# Patient Record
Sex: Male | Born: 1945 | Race: White | Hispanic: No | Marital: Single
Health system: Northeastern US, Academic
[De-identification: ages and names within clinical notes are randomized; demographics above are authoritative.]

## PROBLEM LIST (undated history)

## (undated) DIAGNOSIS — H9190 Unspecified hearing loss, unspecified ear: Secondary | ICD-10-CM

## (undated) DIAGNOSIS — M1990 Unspecified osteoarthritis, unspecified site: Secondary | ICD-10-CM

## (undated) HISTORY — PX: FRACTURE SURGERY: SHX138

## (undated) HISTORY — DX: Unspecified osteoarthritis, unspecified site: M19.90

---

## 2010-04-25 ENCOUNTER — Emergency Department (HOSPITAL_COMMUNITY): Admission: EM | Admit: 2010-04-25 | Discharge: 2010-04-25 | Payer: Self-pay | Admitting: Emergency Medicine

## 2013-10-05 ENCOUNTER — Ambulatory Visit (INDEPENDENT_AMBULATORY_CARE_PROVIDER_SITE_OTHER): Payer: Medicare HMO | Admitting: Family

## 2013-10-05 ENCOUNTER — Encounter: Payer: Self-pay | Admitting: Family

## 2013-10-05 VITALS — BP 130/84 | HR 58 | Ht 70.0 in | Wt 178.0 lb

## 2013-10-05 DIAGNOSIS — L409 Psoriasis, unspecified: Secondary | ICD-10-CM

## 2013-10-05 DIAGNOSIS — R200 Anesthesia of skin: Secondary | ICD-10-CM

## 2013-10-05 DIAGNOSIS — E7800 Pure hypercholesterolemia, unspecified: Secondary | ICD-10-CM

## 2013-10-05 DIAGNOSIS — L408 Other psoriasis: Secondary | ICD-10-CM

## 2013-10-05 DIAGNOSIS — Z87828 Personal history of other (healed) physical injury and trauma: Secondary | ICD-10-CM | POA: Insufficient documentation

## 2013-10-05 DIAGNOSIS — R209 Unspecified disturbances of skin sensation: Secondary | ICD-10-CM

## 2013-10-05 LAB — BASIC METABOLIC PANEL
BUN: 20 mg/dL (ref 6–23)
CALCIUM: 9 mg/dL (ref 8.4–10.5)
CHLORIDE: 107 meq/L (ref 96–112)
CO2: 29 meq/L (ref 19–32)
Creatinine, Ser: 1 mg/dL (ref 0.4–1.5)
GFR: 79.26 mL/min (ref >60.00)
GLUCOSE: 84 mg/dL (ref 70–99)
POTASSIUM: 4.3 meq/L (ref 3.5–5.1)
SODIUM: 141 meq/L (ref 135–145)

## 2013-10-05 LAB — CBC WITH DIFFERENTIAL/PLATELET
BASOS ABS: 0 10*3/uL (ref 0.0–0.1)
Basophils Relative: 0.4 % (ref 0.0–3.0)
EOS PCT: 0.9 % (ref 0.0–5.0)
Eosinophils Absolute: 0.1 10*3/uL (ref 0.0–0.7)
HCT: 42.4 % (ref 39.0–52.0)
Hemoglobin: 14.5 g/dL (ref 13.0–17.0)
LYMPHS PCT: 16.3 % (ref 12.0–46.0)
Lymphs Abs: 1.3 10*3/uL (ref 0.7–4.0)
MCHC: 34.2 g/dL (ref 30.0–36.0)
MCV: 93.4 fl (ref 78.0–100.0)
MONO ABS: 0.6 10*3/uL (ref 0.1–1.0)
MONOS PCT: 7.3 % (ref 3.0–12.0)
Neutro Abs: 5.8 10*3/uL (ref 1.4–7.7)
Neutrophils Relative %: 75.1 % (ref 43.0–77.0)
Platelets: 154 10*3/uL (ref 150.0–400.0)
RBC: 4.54 Mil/uL (ref 4.22–5.81)
RDW: 13.7 % (ref 11.5–14.6)
WBC: 7.8 10*3/uL (ref 4.5–10.5)

## 2013-10-05 LAB — LIPID PANEL
CHOL/HDL RATIO: 5
CHOLESTEROL: 179 mg/dL (ref 0–200)
HDL: 35.7 mg/dL — AB (ref >39.00)
LDL Cholesterol: 105 mg/dL — ABNORMAL HIGH (ref 0–99)
Triglycerides: 193 mg/dL — ABNORMAL HIGH (ref 0.0–149.0)
VLDL: 38.6 mg/dL (ref 0.0–40.0)

## 2013-10-05 LAB — HEPATIC FUNCTION PANEL
ALBUMIN: 3.7 g/dL (ref 3.5–5.2)
ALT: 14 U/L (ref 0–53)
AST: 17 U/L (ref 0–37)
Alkaline Phosphatase: 114 U/L (ref 39–117)
BILIRUBIN TOTAL: 1 mg/dL (ref 0.3–1.2)
Bilirubin, Direct: 0.1 mg/dL (ref 0.0–0.3)
TOTAL PROTEIN: 6.7 g/dL (ref 6.0–8.3)

## 2013-10-05 LAB — TSH: TSH: 0.89 u[IU]/mL (ref 0.35–5.50)

## 2013-10-05 NOTE — Patient Instructions (Signed)
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
## 2013-10-05 NOTE — Progress Notes (Signed)
Pre visit review using our clinic review tool, if applicable. No additional management support is needed unless otherwise documented below in the visit note. 

## 2013-10-18 ENCOUNTER — Encounter: Payer: Self-pay | Admitting: Family

## 2013-10-18 ENCOUNTER — Ambulatory Visit (INDEPENDENT_AMBULATORY_CARE_PROVIDER_SITE_OTHER): Payer: Medicare HMO | Admitting: Family

## 2013-10-18 VITALS — BP 140/78 | HR 65 | Ht 70.0 in | Wt 181.0 lb

## 2013-10-18 DIAGNOSIS — Z125 Encounter for screening for malignant neoplasm of prostate: Secondary | ICD-10-CM

## 2013-10-18 DIAGNOSIS — Z23 Encounter for immunization: Secondary | ICD-10-CM

## 2013-10-18 DIAGNOSIS — Z0000 Encounter for general adult medical examination without abnormal findings: Secondary | ICD-10-CM

## 2013-10-18 DIAGNOSIS — R413 Other amnesia: Secondary | ICD-10-CM

## 2013-10-18 LAB — PSA: PSA: 2.17 ng/mL (ref 0.10–4.00)

## 2013-10-18 NOTE — Progress Notes (Signed)
Pre visit review using our clinic review tool, if applicable. No additional management support is needed unless otherwise documented below in the visit note. 

## 2013-10-18 NOTE — Patient Instructions (Signed)
Dementia Dementia is a general term for problems with brain function. A person with dementia has memory loss and a hard time with at least one other brain function such as thinking, speaking, or problem solving. Dementia can affect social functioning, how you do your job, your mood, or your personality. The changes may be hidden for a long time. The earliest forms of this disease are usually not detected by family or friends. Dementia can be:  Irreversible.  Potentially reversible.  Partially reversible.  Progressive. This means it can get worse over time. CAUSES  Irreversible dementia causes may include:  Degeneration of brain cells (Alzheimer's disease or lewy body dementia).  Multiple small strokes (vascular dementia).  Infection (chronic meningitis or Creutzfelt-Jakob disease).  Frontotemporal dementia. This affects younger people, age 40 to 70, compared to those who have Alzheimer's disease.  Dementia associated with other disorders like Parkinson's disease, Huntington's disease, or HIV-associated dementia. Potentially or partially reversible dementia causes may include:  Medicines.  Metabolic causes such as excessive alcohol intake, vitamin B12 deficiency, or thyroid disease.  Masses or pressure in the brain such as a tumor, blood clot, or hydrocephalus. SYMPTOMS  Symptoms are often hard to detect. Family members or coworkers may not notice them early in the disease process. Different people with dementia may have different symptoms. Symptoms can include:  A hard time with memory, especially recent memory. Long-term memory may not be impaired.  Asking the same question multiple times or forgetting something someone just said.  A hard time speaking your thoughts or finding certain words.  A hard time solving problems or performing familiar tasks (such as how to use a telephone).  Sudden changes in mood.  Changes in personality, especially increasing moodiness or  mistrust.  Depression.  A hard time understanding complex ideas that were never a problem in the past. DIAGNOSIS  There are no specific tests for dementia.   Your caregiver may recommend a thorough evaluation. This is because some forms of dementia can be reversible. The evaluation will likely include a physical exam and getting a detailed history from you and a family member. The history often gives the best clues and suggestions for a diagnosis.  Memory testing may be done. A detailed brain function evaluation called neuropsychologic testing may be helpful.  Lab tests and brain imaging (such as a CT scan or MRI scan) are sometimes important.  Sometimes observation and re-evaluation over time is very helpful. TREATMENT  Treatment depends on the cause.   If the problem is a vitamin deficiency, it may be helped or cured with supplements.  For dementias such as Alzheimer's disease, medicines are available to stabilize or slow the course of the disease. There are no cures for this type of dementia.  Your caregiver can help direct you to groups, organizations, and other caregivers to help with decisions in the care of you or your loved one. HOME CARE INSTRUCTIONS The care of individuals with dementia is varied and dependent upon the progression of the dementia. The following suggestions are intended for the person living with, or caring for, the person with dementia.  Create a safe environment.  Remove the locks on bathroom doors to prevent the person from accidentally locking himself or herself in.  Use childproof latches on kitchen cabinets and any place where cleaning supplies, chemicals, or alcohol are kept.  Use childproof covers in unused electrical outlets.  Install childproof devices to keep doors and windows secured.  Remove stove knobs or install safety   knobs and an automatic shut-off on the stove.  Lower the temperature on water heaters.  Label medicines and keep them  locked up.  Secure knives, lighters, matches, power tools, and guns, and keep these items out of reach.  Keep the house free from clutter. Remove rugs or anything that might contribute to a fall.  Remove objects that might break and hurt the person.  Make sure lighting is good, both inside and outside.  Install grab rails as needed.  Use a monitoring device to alert you to falls or other needs for help.  Reduce confusion.  Keep familiar objects and people around.  Use night lights or dim lights at night.  Label items or areas.  Use reminders, notes, or directions for daily activities or tasks.  Keep a simple, consistent routine for waking, meals, bathing, dressing, and bedtime.  Create a calm, quiet environment.  Place large clocks and calendars prominently.  Display emergency numbers and home address near all telephones.  Use cues to establish different times of the day. An example is to open curtains to let the natural light in during the day.   Use effective communication.  Choose simple words and short sentences.  Use a gentle, calm tone of voice.  Be careful not to interrupt.  If the person is struggling to find a word or communicate a thought, try to provide the word or thought.  Ask one question at a time. Allow the person ample time to answer questions. Repeat the question again if the person does not respond.  Reduce nighttime restlessness.  Provide a comfortable bed.  Have a consistent nighttime routine.  Ensure a regular walking or physical activity schedule. Involve the person in daily activities as much as possible.  Limit napping during the day.  Limit caffeine.  Attend social events that stimulate rather than overwhelm the senses.  Encourage good nutrition and hydration.  Reduce distractions during meal times and snacks.  Avoid foods that are too hot or too cold.  Monitor chewing and swallowing ability.  Continue with routine vision,  hearing, dental, and medical screenings.  Only give over-the-counter or prescription medicines as directed by the caregiver.  Monitor driving abilities. Do not allow the person to drive when safe driving is no longer possible.  Register with an identification program which could provide location assistance in the event of a missing person situation. SEEK MEDICAL CARE IF:   New behavioral problems start such as moodiness, aggressiveness, or seeing things that are not there (hallucinations).  Any new problem with brain function happens. This includes problems with balance, speech, or falling a lot.  Problems with swallowing develop.  Any symptoms of other illness happen. Small changes or worsening in any aspect of brain function can be a sign that the illness is getting worse. It can also be a sign of another medical illness such as infection. Seeing a caregiver right away is important. SEEK IMMEDIATE MEDICAL CARE IF:   A fever develops.  New or worsened confusion develops.  New or worsened sleepiness develops.  Staying awake becomes hard to do. Document Released: 03/04/2001 Document Revised: 12/01/2011 Document Reviewed: 02/03/2011 [employer] Patient Information 2014 [employer].  

## 2013-10-18 NOTE — Progress Notes (Signed)
Subjective:    Patient ID: Richard Hutchinson, male    DOB: 03-Nov-1945, 68 y.o.   MRN: [id number]  HPI 68 year old male, nonsmoker, hearing-impaired male is in today for CPX. He has concerns that his memory is not as good as it once was. Has no problems with long-term memory but short-term memory is not good. Wife complains that he often wanders. He reports misplacing items at times.  All immunizations and health maintenance protocols were reviewed with the patient and they are up to date with these protocols. Screening laboratory values were reviewed with the patient including screening of hyperlipidemia PSA renal function and hepatic function. There medications past medical history social history problem list and allergies were reviewed in detail. Goals were established with regard to weight loss exercise diet in compliance with medications   Review of Systems  Constitutional: Negative.        Hearing-impaired  Eyes: Negative.   Respiratory: Negative.   Cardiovascular: Negative.   Gastrointestinal: Negative.   Endocrine: Negative.   Genitourinary: Negative.   Musculoskeletal: Negative.   Skin: Negative.   Neurological: Negative for dizziness, facial asymmetry and headaches.       Memory disturbance  Hematological: Negative.   Psychiatric/Behavioral: Negative.    Past Medical History  Diagnosis Date  . Arthritis     History   Social History  . Marital Status: Married    Spouse Name: N/A    Number of Children: N/A  . Years of Education: N/A   Occupational History  . Not on file.   Social History Main Topics  . Smoking status: Never Smoker   . Smokeless tobacco: Not on file  . Alcohol Use: Yes  . Drug Use: No  . Sexual Activity: Not on file   Other Topics Concern  . Not on file   Social History Narrative  . No narrative on file    History reviewed. No pertinent past surgical history.  No family history on file.  No Known Allergies  No current outpatient  prescriptions on file prior to visit.   No current facility-administered medications on file prior to visit.    BP 140/78  Pulse 65  Ht 5\' 10"  (1.778 m)  Wt 181 lb (82.101 kg)  BMI 25.97 kg/m2chart    Objective:   Physical Exam  Constitutional: He is oriented to person, place, and time. He appears well-developed and well-nourished.  Hearing-impaired, interpreter present  HENT:  Head: Normocephalic and atraumatic.  Right Ear: External ear normal.  Left Ear: External ear normal.  Nose: Nose normal.  Mouth/Throat: Oropharynx is clear and moist.  Eyes: Conjunctivae and EOM are normal. Pupils are equal, round, and reactive to light.  Neck: Normal range of motion. Neck supple. No thyromegaly present.  Cardiovascular: Normal rate, regular rhythm and normal heart sounds.   Pulmonary/Chest: Effort normal and breath sounds normal.  Abdominal: Soft. Bowel sounds are normal. He exhibits no distension. There is no tenderness. There is no guarding.  Genitourinary: Rectum normal, prostate normal and penis normal. Guaiac negative stool. No penile tenderness.  Musculoskeletal: Normal range of motion. He exhibits no edema and no tenderness.  Neurological: He is alert and oriented to person, place, and time. He has normal reflexes.  Skin: Skin is warm and dry.  Psychiatric: He has a normal mood and affect.          Assessment & Plan:  Assessment:  1. complete physical exam-PSA sent. Encouraged healthy diet and exercise. Recommended colonoscopy. Patient  wishes to call me back and let me now when he is ready to proceed with that procedure. Declines today. 2. Memory disturbance-encourage CT of the head to further evaluate memory disturbance. Patient declines at this time. Therefore, we will not consider medication until we can rule out any other cause of possible memory disturbance. 3. Hearing impaired

## 2013-11-22 ENCOUNTER — Telehealth: Payer: Self-pay | Admitting: Family

## 2013-11-22 DIAGNOSIS — M5416 Radiculopathy, lumbar region: Secondary | ICD-10-CM

## 2013-11-22 DIAGNOSIS — R413 Other amnesia: Secondary | ICD-10-CM

## 2013-11-22 NOTE — Telephone Encounter (Signed)
Received the following email from patient.  Returned call to pt's wife per his request and advised per PCP and chart note, pt had previously declined order for CT scan of brain and back and also referral to GI for colonoscopy.  Wife states pt would like to have order for CT scans to be performed in April as patient will be going out of town and will not return until March 30th.  Wife states she will discuss the referral to GI with patient and then follow up with [place] later.  [place] to enter order, wife to assist patient with signing up for mychart.    I don't know what steps I need to take next. I know you were going to do a back scan to see if that is the problem with my circulation and also brain scan to check why my memory is failing. Should I find a doctor for both of these things or are you going to recommend someone?   Thank you.   Lino [place] can contact my wife with any information in this regard.   She can be reached at [email or web address] or [phone]. Her name is Richard Hutchinson

## 2013-12-26 ENCOUNTER — Emergency Department (HOSPITAL_COMMUNITY)
Admission: EM | Admit: 2013-12-26 | Discharge: 2013-12-26 | Disposition: A | Discharge status: Home / Self Care | Payer: Medicare HMO | Attending: Emergency Medicine | Admitting: Emergency Medicine

## 2013-12-26 ENCOUNTER — Encounter (HOSPITAL_COMMUNITY): Payer: Self-pay | Admitting: Emergency Medicine

## 2013-12-26 ENCOUNTER — Emergency Department (HOSPITAL_COMMUNITY): Payer: Medicare HMO

## 2013-12-26 DIAGNOSIS — M1710 Unilateral primary osteoarthritis, unspecified knee: Secondary | ICD-10-CM | POA: Diagnosis not present

## 2013-12-26 DIAGNOSIS — Z791 Long term (current) use of non-steroidal anti-inflammatories (NSAID): Secondary | ICD-10-CM | POA: Diagnosis not present

## 2013-12-26 DIAGNOSIS — IMO0002 Reserved for concepts with insufficient information to code with codable children: Principal | ICD-10-CM

## 2013-12-26 DIAGNOSIS — M25569 Pain in unspecified knee: Secondary | ICD-10-CM | POA: Diagnosis present

## 2013-12-26 DIAGNOSIS — M1712 Unilateral primary osteoarthritis, left knee: Secondary | ICD-10-CM

## 2013-12-26 MED ORDER — HYDROCODONE-ACETAMINOPHEN 5-325 MG PO TABS: 2.0000 | ORAL_TABLET | ORAL | Status: DC | PRN

## 2013-12-26 MED ORDER — NAPROXEN 500 MG PO TABS: 500.0000 mg | ORAL_TABLET | Freq: Two times a day (BID) | ORAL | Status: DC

## 2013-12-26 MED ORDER — NAPROXEN 250 MG PO TABS
500.0000 mg | ORAL_TABLET | Freq: Once | ORAL | Status: AC
  Administered 2013-12-26: 500 mg via ORAL
  Filled 2013-12-26: qty 2

## 2013-12-26 NOTE — ED Notes (Signed)
Pt st's he was in [place] approx 1 month ago and stuck by a cactus in his left knee.  Pt st's his knee became infected.  St's he has continued to have pain in this knee with swelling.  Thinks maybe there is a broken off thorn in his knee.

## 2013-12-26 NOTE — ED Provider Notes (Signed)
This patient was seen by Junius Creamer, NP.   He is deaf and mute. He presents for left knee pain and the primary interview was done writing on paper with pen. 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 At the ned of her shift, they were still waiting for the interpretor therefore I was asked to discuss his case and plan with him.  The patient confirms what Junius Creamer, NP writes in her HPI. I agree with her physical exam findings, diagnosis and treatment plan. I discussed the plan with the patient using interpretor and answered all of his questions.  68 y.o.Devean Dolliver's evaluation in the Emergency Department is complete. It has been determined that no acute conditions requiring further emergency intervention are present at this time. The patient/guardian have been advised of the diagnosis and plan. We have discussed signs and symptoms that warrant return to the ED, such as changes or worsening in symptoms.  Vital signs are stable at discharge. Filed Vitals:   12/26/13 0703  BP: 132/79  Pulse: 66  Temp:   Resp:     Patient/guardian has voiced understanding and agreed to follow-up with the PCP or specialist.   Linus Mako, PA-C 12/26/13 [phone]

## 2013-12-26 NOTE — ED Provider Notes (Signed)
CSN: [id number]     Arrival date & time 12/26/13  0137 History   First MD Initiated Contact with Patient 12/26/13 0440     Chief Complaint  Patient presents with  . Knee Pain     (Consider location/radiation/quality/duration/timing/severity/associated sxs/prior Treatment) HPI Comments: Patient is a deaf, mute, with no murmurs use this form of communication.  He, states, that approximately one month ago.  He was in [place] and accidentally was stuck by a cactus needle in his left knee.  At that time.  He became quite red and inflamed.  He was placed on antibiotics, and the swelling, and redness subsided.  Since that time has had persistent pain, tonight, worse.  Has not taken any over-the-counter medication or any prescribed medication for discomfort. He does have a local physician, but has not made an appointment for evaluation  Patient is a 68 y.o. male presenting with knee pain. The history is provided by the patient. The history is limited by a language barrier.  Knee Pain Location:  Knee Time since incident:  1 month Injury: yes   Mechanism of injury comment:  Cactus needle Knee location:  L knee Pain details:    Quality:  Sharp   Radiates to:  Does not radiate   Severity:  Moderate   Onset quality:  Gradual   Duration:  1 month   Timing:  Constant   Progression:  Worsening Chronicity:  Recurrent Dislocation: no   Foreign body present:  Unable to specify Tetanus status:  Up to date Prior injury to area:  Yes Relieved by:  None tried Ineffective treatments:  None tried Associated symptoms: no decreased ROM, no fever and no swelling     Past Medical History  Diagnosis Date  . Arthritis    Past Surgical History  Procedure Laterality Date  . Fracture surgery     No family history on file. History  Substance Use Topics  . Smoking status: Never Smoker   . Smokeless tobacco: Not on file  . Alcohol Use: Yes    Review of Systems  Constitutional: Negative for fever.   Musculoskeletal: Positive for arthralgias. Negative for joint swelling.  Skin: Negative for rash and wound.  Neurological: Negative for dizziness.  All other systems reviewed and are negative.      Allergies  Review of patient's allergies indicates no known allergies.  Home Medications   Current Outpatient Rx  Name  Route  Sig  Dispense  Refill  . HYDROcodone-acetaminophen (NORCO/VICODIN) 5-325 MG per tablet   Oral   Take 2 tablets by mouth every 4 (four) hours as needed.   20 tablet   0   . naproxen (NAPROSYN) 500 MG tablet   Oral   Take 1 tablet (500 mg total) by mouth 2 (two) times daily with a meal.   60 tablet   0    BP 139/88  Pulse 96  Temp(Src) 97.5 F (36.4 C) (Oral)  Resp 16  Ht 6' (1.829 m)  Wt 185 lb (83.915 kg)  BMI 25.08 kg/m2  SpO2 97% Physical Exam  Nursing note and vitals reviewed. Constitutional: He appears well-developed and well-nourished.  HENT:  Head: Normocephalic.  Eyes: Pupils are equal, round, and reactive to light.  Neck: Normal range of motion.  Cardiovascular: Normal rate and regular rhythm.   Pulmonary/Chest: Effort normal and breath sounds normal.  Musculoskeletal: He exhibits tenderness. He exhibits no edema.       Left knee: He exhibits normal range of motion, no  swelling, no effusion, no deformity, no laceration, no erythema, normal alignment, no LCL laxity and no bony tenderness. Tenderness found. Medial joint line tenderness noted.       Legs: Skin: Skin is warm. No erythema.    ED Course  Procedures (including critical care time) Labs Review Labs Reviewed - No data to display Imaging Review Dg Knee Complete 4 Views Left  12/26/2013   CLINICAL DATA:  Injury from cactus to left knee, with persistent left knee pain and swelling. Assess for foreign body.  EXAM: LEFT KNEE - COMPLETE 4+ VIEW  COMPARISON:  None.  FINDINGS: There is no evidence of fracture or dislocation. There is mild medial compartment narrowing. Marginal  osteophytes are seen arising at the medial and patellofemoral compartments. Mild cortical irregularity is noted along the articular surface of the lateral femoral condyle.  A small knee joint effusion is seen. No definite radiopaque foreign body is identified; a cactus needle may not be visible on radiograph.  IMPRESSION: 1. No evidence of fracture or dislocation. 2. Tricompartmental osteoarthritis noted, with mild narrowing of the medial compartment. 3. Small knee joint effusion seen. 4. No definite radiopaque foreign body seen; a cactus needle may not be visible on radiograph.   Electronically Signed   By: Garald Balding M.D.   On: 12/26/2013 04:01     EKG Interpretation None      MDM  I'm awaiting the sign language interpreter for discharge instructions Final diagnoses:  Arthritis of left knee         Garald Balding, NP 12/26/13 0530

## 2013-12-26 NOTE — Discharge Instructions (Signed)
You've been given 2 prescriptions today.  One is an anti-inflammatory that, I would like you to take on a regular basis, as instructed.  The second is for a narcotic pain medicine that, you can take for severe pain, basically if you going to stay home.  He should not drive or operate machinery with this. Please make up with a primary care physician for further evaluation and follow

## 2013-12-27 NOTE — ED Provider Notes (Signed)
Medical screening examination/treatment/procedure(s) were performed by non-physician practitioner and as supervising physician I was immediately available for consultation/collaboration.   Teressa Lower, MD 12/27/13 [phone]

## 2013-12-28 NOTE — ED Provider Notes (Signed)
Medical screening examination/treatment/procedure(s) were performed by non-physician practitioner and as supervising physician I was immediately available for consultation/collaboration.   EKG Interpretation None       Teressa Lower, MD 12/28/13 [phone]

## 2014-01-02 ENCOUNTER — Encounter: Payer: Self-pay | Admitting: Family

## 2014-01-02 ENCOUNTER — Ambulatory Visit (INDEPENDENT_AMBULATORY_CARE_PROVIDER_SITE_OTHER): Payer: Medicare HMO | Admitting: Family

## 2014-01-02 VITALS — BP 142/80 | HR 88 | Temp 97.8°F | Ht 72.0 in | Wt 200.0 lb

## 2014-01-02 DIAGNOSIS — L309 Dermatitis, unspecified: Secondary | ICD-10-CM

## 2014-01-02 DIAGNOSIS — M1712 Unilateral primary osteoarthritis, left knee: Secondary | ICD-10-CM

## 2014-01-02 DIAGNOSIS — M1710 Unilateral primary osteoarthritis, unspecified knee: Secondary | ICD-10-CM

## 2014-01-02 DIAGNOSIS — L259 Unspecified contact dermatitis, unspecified cause: Secondary | ICD-10-CM

## 2014-01-02 DIAGNOSIS — IMO0002 Reserved for concepts with insufficient information to code with codable children: Secondary | ICD-10-CM

## 2014-01-02 MED ORDER — DOXYCYCLINE HYCLATE 100 MG PO TABS: 100.0000 mg | ORAL_TABLET | Freq: Two times a day (BID) | ORAL | Status: DC

## 2014-01-02 MED ORDER — CLOTRIMAZOLE-BETAMETHASONE 1-0.05 % EX CREA: 1.0000 "application " | TOPICAL_CREAM | Freq: Two times a day (BID) | CUTANEOUS | Status: AC

## 2014-01-02 MED ORDER — MELOXICAM 7.5 MG PO TABS: 7.5000 mg | ORAL_TABLET | Freq: Every day | ORAL | Status: DC

## 2014-01-02 NOTE — Progress Notes (Signed)
Subjective:    Patient ID: Richard Hutchinson, male    DOB: [birth year], 68 y.o.   MRN: [id number]  HPI  Is a 68 year old white male, nonsmoker is in today for recheck of cellulitis that was seen on 12/05/2013 in [place]. Patient reports being scratched by a cactus and had 5 areas of concern. Reports having severe pain with left foot swelling and was put on Bactrim twice a day. Is improving but still think he needs more antibiotic.  Has concerns of left knee pain has a history of known osteoarthritis of the left knee. Continue naproxen prescribed by the emergency department that was held about 50% and applying ice. Pain 5/10 worst person in the morning.  The patient reports that he is currently under the care of the therapist. Wife reports that they aren't sure if he has a personality disorder or early dementia.  Review of Systems  Constitutional: Negative.   HENT: Negative.   Respiratory: Negative.   Musculoskeletal: Positive for arthralgias.       Left knee pain  Skin: Positive for color change, rash and wound.       Cellulitis left lower leg from a cactus  Neurological: Negative.   Psychiatric/Behavioral: Negative.    Past Medical History  Diagnosis Date  . Arthritis     History   Social History  . Marital Status: Married    Spouse Name: N/A    Number of Children: N/A  . Years of Education: N/A   Occupational History  . Not on file.   Social History Main Topics  . Smoking status: Never Smoker   . Smokeless tobacco: Not on file  . Alcohol Use: Yes  . Drug Use: No  . Sexual Activity: Not on file   Other Topics Concern  . Not on file   Social History Narrative  . No narrative on file    Past Surgical History  Procedure Laterality Date  . Fracture surgery      No family history on file.  No Known Allergies  No current outpatient prescriptions on file prior to visit.   No current facility-administered medications on file prior to visit.    BP 142/80   Pulse 88  Temp(Src) 97.8 F (36.6 C) (Oral)  Ht 6' (1.829 m)  Wt 200 lb (90.719 kg)  BMI 27.12 kg/m2  SpO2 98%chart     Objective:   Physical Exam  Constitutional: He is oriented to person, place, and time. He appears well-developed and well-nourished.  HENT:  Right Ear: External ear normal.  Left Ear: External ear normal.  Nose: Nose normal.  Mouth/Throat: Oropharynx is clear and moist.  Neck: Normal range of motion. Neck supple.  Cardiovascular: Normal rate, regular rhythm and normal heart sounds.   Pulmonary/Chest: Effort normal and breath sounds normal.  Abdominal: Soft. Bowel sounds are normal.  Musculoskeletal: Normal range of motion. He exhibits no edema and no tenderness.  Neurological: He is oriented to person, place, and time.  Skin: Skin is warm. Rash noted. There is erythema.  Left lower leg dry, flaky rash noted to the anterior aspect of the shin. Mild redness extending from the lower leg to the foot. Blanches well. No pain.  Psychiatric: He has a normal mood and affect.          Assessment & Plan:  Richard Hutchinson was seen today for cellulitis.  Diagnoses and associated orders for this visit:  Dermatitis  Osteoarthritis of left knee  Other Orders - meloxicam (MOBIC) 7.5 MG  tablet; Take 1 tablet (7.5 mg total) by mouth daily. - doxycycline (VIBRA-TABS) 100 MG tablet; Take 1 tablet (100 mg total) by mouth 2 (two) times daily. - clotrimazole-betamethasone (LOTRISONE) cream; Apply 1 application topically 2 (two) times daily.   Call the office with any questions or concerns. Recheck as scheduled and as needed.

## 2014-01-02 NOTE — Progress Notes (Signed)
Pre visit review using our clinic review tool, if applicable. No additional management support is needed unless otherwise documented below in the visit note. 

## 2014-01-02 NOTE — Patient Instructions (Signed)
Wear and Tear Disorders of the Knee (Arthritis, Osteoarthritis)  Everyone will experience wear and tear injuries (arthritis, osteoarthritis) of the knee. These are the changes we all get as we age. They come from the joint stress of daily living. The amount of cartilage damage in your knee and your symptoms determine if you need surgery. Mild problems require approximately two months recovery time. More severe problems take several months to recover. With mild problems, your surgeon may find worn and rough cartilage surfaces. With severe changes, your surgeon may find cartilage that has completely worn away and exposed the bone. Loose bodies of bone and cartilage, bone spurs (excess bone growth), and injuries to the menisci (cushions between the large bones of your leg) are also common. All of these problems can cause pain.  For a mild wear and tear problem, rough cartilage may simply need to be shaved and smoothed. For more severe problems with areas of exposed bone, your surgeon may use an instrument for roughing up the bone surfaces to stimulate new cartilage growth. Loose bodies are usually removed. Torn menisci may be trimmed or repaired.  ABOUT THE ARTHROSCOPIC PROCEDURE  Arthroscopy is a surgical technique. It allows your orthopedic surgeon to diagnose and treat your knee injury with accuracy. The surgeon looks into your knee through a small scope. The scope is like a small (pencil-sized) telescope. Arthroscopy is less invasive than open knee surgery. You can expect a more rapid recovery. After the procedure, you will be moved to a recovery area until most of the effects of the medication have worn off. Your caregiver will discuss the test results with you.  RECOVERY  The severity of the arthritis and the type of procedure performed will determine recovery time. Other important factors include age, physical condition, medical conditions, and the type of rehabilitation program. Strengthening your muscles after  arthroscopy helps guarantee a better recovery. Follow your caregiver's instructions. Use crutches, rest, elevate, ice, and do knee exercises as instructed. Your caregivers will help you and instruct you with exercises and other physical therapy required to regain your mobility, muscle strength, and functioning following surgery. Only take over-the-counter or prescription medicines for pain, discomfort, or fever as directed by your caregiver.   SEEK MEDICAL CARE IF:   · There is increased bleeding (more than a small spot) from the wound.  · You notice redness, swelling, or increasing pain in the wound.  · Pus is coming from wound.  · You develop an unexplained oral temperature above 102° F (38.9° C) , or as your caregiver suggests.  · You notice a foul smell coming from the wound or dressing.  · You have severe pain with motion of the knee.  SEEK IMMEDIATE MEDICAL CARE IF:   · You develop a rash.  · You have difficulty breathing.  · You have any allergic problems.  MAKE SURE YOU:   · Understand these instructions.  · Will watch your condition.  · Will get help right away if you are not doing well or get worse.  Document Released: 09/05/2000 Document Revised: 12/01/2011 Document Reviewed: 02/02/2008  [employer]® Patient Information ©2014 [employer].

## 2014-01-06 ENCOUNTER — Encounter: Payer: Self-pay | Admitting: Family

## 2014-01-12 ENCOUNTER — Encounter: Payer: Self-pay | Admitting: Family

## 2014-01-16 ENCOUNTER — Inpatient Hospital Stay: Admission: RE | Admit: 2014-01-16 | Payer: Medicare HMO | Source: Ambulatory Visit

## 2014-01-20 ENCOUNTER — Other Ambulatory Visit: Payer: Medicare HMO

## 2014-05-25 ENCOUNTER — Telehealth: Payer: Self-pay | Admitting: Family

## 2014-05-25 ENCOUNTER — Telehealth: Payer: Self-pay | Admitting: *Deleted

## 2014-05-25 NOTE — Telephone Encounter (Signed)
Patient Information:  Caller Name: Mardene Celeste  Phone: [phone]  Patient: Carmichael, Burdette  Gender: Male  DOB: [birth year]  Age: 68 Years  PCP: Roxy Cedar [facility])  Office Follow Up:  Does the office need to follow up with this patient?: Yes  Instructions For The Office: Pt needs appt 05/26/14 and will need interpreter for the deaf at the appt, please f/u with pt's wife thank you   Symptoms  Reason For Call & Symptoms: Pt's wife states pt 's platelet count is 106 dated 05/24/14 and advised per another provider to call and schedule appt for pt to be seen in the office for f/u on blood work.  Normal range 150-400.  Pt wil need an appt asap and will also need an interpreter for the deaf at the time of appt.  Reviewed Health History In EMR: Yes  Reviewed Medications In EMR: Yes  Reviewed Allergies In EMR: Yes  Reviewed Surgeries / Procedures: Yes  Date of Onset of Symptoms: 05/25/2014  Guideline(s) Used:  No Protocol Available - Sick Adult  Disposition Per Guideline:   Discuss with PCP and Callback by Nurse Today  Reason For Disposition Reached:   Nursing judgment  Advice Given:  Call Back If:  New symptoms develop  You become worse.  Patient Will Follow Care Advice:  YES

## 2014-05-25 NOTE — Telephone Encounter (Signed)
Dr Grayland Jack

## 2014-05-25 NOTE — Telephone Encounter (Signed)
See result note from today. Pt scheduled to see Dr.Kim on 05/26/14.

## 2014-05-25 NOTE — Telephone Encounter (Signed)
Dr Willene Hatchet (psych) called to inform Richard Hutchinson  the platelet count was low at 106 and states she informed the pts wife as he is deaf and advised her the pt should discontinue Depakote.  Dr Maudie Mercury reviewed the results that were faxed and states the pt should be seen in the office tomorrow for repeat labs and evaluation.  I informed the pts wife of this and advised her per Dr Maudie Mercury if the pt is feeling bad or having any bleeding problems he should go to an urgent care now.  She stated the pt only complains of being tired and needs an intrepreter to come with him to the appt tomorrow as she has to work and per Richard Hutchinson they may not be able to have an intrepreter available as generally a 2 day notice is needed.  I transferred her to Richard Hutchinson to make an appt.

## 2014-05-25 NOTE — Telephone Encounter (Signed)
Error-See complete note.

## 2014-05-26 ENCOUNTER — Ambulatory Visit (INDEPENDENT_AMBULATORY_CARE_PROVIDER_SITE_OTHER): Payer: Medicare HMO | Admitting: Family Medicine

## 2014-05-26 ENCOUNTER — Encounter: Payer: Self-pay | Admitting: Family Medicine

## 2014-05-26 VITALS — BP 120/72 | HR 95 | Temp 97.5°F | Ht 72.0 in | Wt 171.0 lb

## 2014-05-26 DIAGNOSIS — D473 Essential (hemorrhagic) thrombocythemia: Secondary | ICD-10-CM

## 2014-05-26 DIAGNOSIS — D75839 Thrombocytosis, unspecified: Secondary | ICD-10-CM

## 2014-05-26 DIAGNOSIS — H9190 Unspecified hearing loss, unspecified ear: Secondary | ICD-10-CM

## 2014-05-26 DIAGNOSIS — I10 Essential (primary) hypertension: Secondary | ICD-10-CM

## 2014-05-26 DIAGNOSIS — T50905A Adverse effect of unspecified drugs, medicaments and biological substances, initial encounter: Secondary | ICD-10-CM

## 2014-05-26 DIAGNOSIS — F39 Unspecified mood [affective] disorder: Secondary | ICD-10-CM

## 2014-05-26 NOTE — Patient Instructions (Addendum)
-  continue to hold on the depakote and discuss any new medications with your psychiatrist  -follow up with Richard Hutchinson in 1 week and sooner if any symptoms, concerns, bleeding or new symptoms

## 2014-05-26 NOTE — Progress Notes (Addendum)
No chief complaint on file.   HPI: Pt deaf, interpreter present  Acute visit for:  "low platelets" -call from psychiatrist of incidental finding of low platelets on basic routine labs yesterday and ov schedule with me as PCP out of office -started depakote recently per patient report and office staff at psych office  - platelets normal recently prior to starting on depakote roc in may -OF NOTE: prior to appt. requested other labs from psych and he had low platelet 8/19 as well, rest of CBC, CMP, hgba1c ok, depakote level in normal range at that time - very mildly low wbc -also requested OV notes but denied due to hippa? -apparently depakote continued until yesterday, and patient reports just advised to hold depakote yesterday when mild thrombocytopenia persisted -reports: he feels fine, no symptoms -denies: malaise, fever, ha, petechia, purpura, bleeding (skin, GI, urine, mucosal surfaces)  ROS: See pertinent positives and negatives per HPI.  Past Medical History  Diagnosis Date  . Arthritis     Past Surgical History  Procedure Laterality Date  . Fracture surgery      No family history on file.  History   Social History  . Marital Status: Married    Spouse Name: N/A    Number of Children: N/A  . Years of Education: N/A   Social History Main Topics  . Smoking status: Never Smoker   . Smokeless tobacco: None  . Alcohol Use: Yes  . Drug Use: No  . Sexual Activity: None   Other Topics Concern  . None   Social History Narrative  . None    Current outpatient prescriptions:clotrimazole-betamethasone (LOTRISONE) cream, Apply 1 application topically 2 (two) times daily., Disp: 45 g, Rfl: 0;  risperiDONE (RISPERDAL) 2 MG tablet, at bedtime. , Disp: , Rfl:   EXAM:  Filed Vitals:   05/26/14 1326  BP: 120/72  Pulse: 95  Temp: 97.5 F (36.4 C)    Body mass index is 23.19 kg/(m^2).  GENERAL: vitals reviewed and listed above, alert, oriented, appears well hydrated  and in no acute distress  HEENT: atraumatic, PERRLA, conjunttiva clear, no obvious abnormalities on inspection of external nose and ears, oral and nasal mucosa normal  NECK: no obvious masses on inspection or cervical LAD  LUNGS: clear to auscultation bilaterally, no wheezes, rales or rhonchi, good air movement  CV: HRRR, no peripheral edema  SKIN: no petechiae or purpura  MS: moves all extremities without noticeable abnormality  PSYCH: pleasant and cooperative, no obvious depression or anxiety  ASSESSMENT AND PLAN:  Discussed the following assessment and plan:  Thrombocytosis  Unspecified episodic mood disorder  Deaf, unspecified laterality  Medication reaction, initial encounter  -we discussed possible serious and likely etiologies, workup and treatment, treatment risks and return precautions -asymptomatic mild thrombocytopenia for at least several weeks after starting depakote, likely drug induced -advised smear today, but patient reports he really hates getting blood drawn and is very adamant since he feels fine that he really does not want to draw blood again today -after this discussion, Jovante opted for - holding Depakote and close follow up with PCP in to recheck/obtain smear, consider hematology referral if persists or other concerns -of course, we advised Alexy  to return or notify a doctor immediately if symptoms worsen or persist or new concerns arise.  -Patient advised to return or notify a doctor immediately if symptoms worsen or persist or new concerns arise.  Patient Instructions  -continue to hold on the depakote and discuss any new  medications with your psychiatrist  -follow up with Padonda Cambell in 1 week and sooner if any symptoms, concerns, bleeding or new symptoms     Marjorie Deprey R.

## 2014-05-26 NOTE — Progress Notes (Signed)
Pre visit review using our clinic review tool, if applicable. No additional management support is needed unless otherwise documented below in the visit note. 

## 2014-06-02 ENCOUNTER — Ambulatory Visit (INDEPENDENT_AMBULATORY_CARE_PROVIDER_SITE_OTHER): Payer: Commercial Managed Care - HMO | Admitting: Family

## 2014-06-02 ENCOUNTER — Encounter: Payer: Self-pay | Admitting: Family

## 2014-06-02 VITALS — BP 90/70 | HR 98 | Ht 72.0 in | Wt 171.0 lb

## 2014-06-02 DIAGNOSIS — F0630 Mood disorder due to known physiological condition, unspecified: Secondary | ICD-10-CM | POA: Insufficient documentation

## 2014-06-02 DIAGNOSIS — H9190 Unspecified hearing loss, unspecified ear: Secondary | ICD-10-CM

## 2014-06-02 DIAGNOSIS — D473 Essential (hemorrhagic) thrombocythemia: Secondary | ICD-10-CM

## 2014-06-02 DIAGNOSIS — D75839 Thrombocytosis, unspecified: Secondary | ICD-10-CM

## 2014-06-02 LAB — CBC WITH DIFFERENTIAL/PLATELET
Basophils Absolute: 0 10*3/uL (ref 0.0–0.1)
Basophils Relative: 0.6 % (ref 0.0–3.0)
EOS ABS: 0.2 10*3/uL (ref 0.0–0.7)
EOS PCT: 3.7 % (ref 0.0–5.0)
HCT: 36.8 % — ABNORMAL LOW (ref 39.0–52.0)
Hemoglobin: 12.6 g/dL — ABNORMAL LOW (ref 13.0–17.0)
Lymphocytes Relative: 19.7 % (ref 12.0–46.0)
Lymphs Abs: 0.9 10*3/uL (ref 0.7–4.0)
MCHC: 34.2 g/dL (ref 30.0–36.0)
MCV: 91.4 fl (ref 78.0–100.0)
MONOS PCT: 11.1 % (ref 3.0–12.0)
Monocytes Absolute: 0.5 10*3/uL (ref 0.1–1.0)
NEUTROS PCT: 64.9 % (ref 43.0–77.0)
Neutro Abs: 3.1 10*3/uL (ref 1.4–7.7)
PLATELETS: 125 10*3/uL — AB (ref 150.0–400.0)
RBC: 4.03 Mil/uL — ABNORMAL LOW (ref 4.22–5.81)
RDW: 13.6 % (ref 11.5–15.5)
WBC: 4.7 10*3/uL (ref 4.0–10.5)

## 2014-06-02 NOTE — Progress Notes (Signed)
   Subjective:    Patient ID: Richard Hutchinson, male    DOB: May 26, 1946, 67 y.o.   MRN: [id number]  HPI  68 year old death male, nonsmoker,  with a history of mood disorder is in today in after being taken off the Depakote and after experiencing thrombocytosis. At his last office visit he refused labs. He continues to be off Depakote. Psychiatry has not resumed medication. Platelet count 122 on 8/19 and 106 on 9/2.   Review of Systems  Constitutional: Negative.   HENT: Negative.        Death  Respiratory: Negative.   Cardiovascular: Negative.   Gastrointestinal: Negative.   Endocrine: Negative.   Genitourinary: Negative.   Musculoskeletal: Negative.   Skin: Negative.   Allergic/Immunologic: Negative.   Neurological: Negative.   Psychiatric/Behavioral: Negative.    Past Medical History  Diagnosis Date  . Arthritis     History   Social History  . Marital Status: Married    Spouse Name: N/A    Number of Children: N/A  . Years of Education: N/A   Occupational History  . Not on file.   Social History Main Topics  . Smoking status: Never Smoker   . Smokeless tobacco: Not on file  . Alcohol Use: Yes  . Drug Use: No  . Sexual Activity: Not on file   Other Topics Concern  . Not on file   Social History Narrative  . No narrative on file    Past Surgical History  Procedure Laterality Date  . Fracture surgery      No family history on file.  No Known Allergies  Current Outpatient Prescriptions on File Prior to Visit  Medication Sig Dispense Refill  . clotrimazole-betamethasone (LOTRISONE) cream Apply 1 application topically 2 (two) times daily.  45 g  0  . risperiDONE (RISPERDAL) 2 MG tablet at bedtime.        No current facility-administered medications on file prior to visit.    BP 90/70  Pulse 98  Ht 6' (1.829 m)  Wt 171 lb (77.565 kg)  BMI 23.19 kg/m2chart    Objective:    Physical Exam  Constitutional: He is oriented to person, place, and time. He  appears well-developed and well-nourished.  HENT:  Right Ear: External ear normal.  Left Ear: External ear normal.  Nose: Nose normal.  Mouth/Throat: Oropharynx is clear and moist.  Neck: Normal range of motion. Neck supple.  Cardiovascular: Normal rate and normal heart sounds.   Pulmonary/Chest: Effort normal and breath sounds normal.  Abdominal: Soft. Bowel sounds are normal.  Musculoskeletal: Normal range of motion.  Neurological: He is alert and oriented to person, place, and time.  Skin: Skin is warm and dry.  Psychiatric: He has a normal mood and affect.          Assessment & Plan:  Richard Hutchinson was seen today for follow-up.  Diagnoses and associated orders for this visit:  Thrombocytosis - CBC with Differential  Deaf, unspecified laterality  Mood disorder in conditions classified elsewhere   Call the office with any questions or concerns. If abnormalities with platelets continues, refer to hematology for further management.

## 2014-06-02 NOTE — Progress Notes (Signed)
Pre visit review using our clinic review tool, if applicable. No additional management support is needed unless otherwise documented below in the visit note. 

## 2014-06-02 NOTE — Patient Instructions (Signed)
Platelet Count The platelet count is the number of platelets per mm in a sample of your blood. Platelets are an important part of the blood's clotting process. This test is used to evaluate patients who develop bleeding disorders. PREPARATION FOR TEST No preparation or fasting is needed.  Avoid strenuous exercise prior to this test.  For females, inform your caregiver if you are pregnant or menstruating. NORMAL FINDINGS  Adult/elderly: 150,000 to 400,000/mm or 150 to 400 x 109/L (SI units)  Premature infant: 100,000 to 300,000/ mm  Newborn: 150,000 to 300,000/ mm  Infant: 200,000 to 475,000/ mm  Child: 150,000 to 400,000/ mm Possible critical values: less than 50,000 or greater than 1 million/ mm Ranges for normal findings may vary among different laboratories and hospitals. You should always check with your caregiver after having lab work or other tests done to discuss the meaning of your test results and whether your values are considered within normal limits. MEANING OF TEST  Your caregiver will go over the test results with you. Your caregiver will discuss the importance and meaning of your results. He or she will also discuss treatment options and additional tests, if needed. OBTAINING THE TEST RESULTS It is your responsibility to obtain your test results. Ask the lab or department performing the test when and how you will get your results. Document Released: 10/03/2004 Document Revised: 12/01/2011 Document Reviewed: 08/20/2008 [facility] Patient Information 2015 [place], [place]. This information is not intended to replace advice given to you by your health care provider. Make sure you discuss any questions you have with your health care provider.

## 2014-07-11 ENCOUNTER — Telehealth: Payer: Self-pay | Admitting: Family

## 2014-07-11 NOTE — Telephone Encounter (Signed)
Andee Poles states she needs to talk to you about pt's recent weight loss and depression.  She says there are a few other things going on with him that needs to discuss and the recent medication she's prescribing him.  She would like a callback.  Andee Poles is the pt's psychiatrist.

## 2014-07-17 NOTE — Telephone Encounter (Signed)
Called and left a message with receptionist

## 2014-09-18 ENCOUNTER — Ambulatory Visit: Payer: Commercial Managed Care - HMO

## 2014-09-18 ENCOUNTER — Ambulatory Visit (INDEPENDENT_AMBULATORY_CARE_PROVIDER_SITE_OTHER): Payer: Commercial Managed Care - HMO

## 2014-09-18 DIAGNOSIS — Z23 Encounter for immunization: Secondary | ICD-10-CM

## 2014-10-03 DIAGNOSIS — F99 Mental disorder, not otherwise specified: Secondary | ICD-10-CM | POA: Diagnosis not present

## 2014-10-17 DIAGNOSIS — F99 Mental disorder, not otherwise specified: Secondary | ICD-10-CM | POA: Diagnosis not present

## 2014-10-31 DIAGNOSIS — F99 Mental disorder, not otherwise specified: Secondary | ICD-10-CM | POA: Diagnosis not present

## 2014-11-02 ENCOUNTER — Ambulatory Visit: Payer: Commercial Managed Care - HMO | Admitting: Internal Medicine

## 2014-11-07 DIAGNOSIS — F99 Mental disorder, not otherwise specified: Secondary | ICD-10-CM | POA: Diagnosis not present

## 2014-11-09 ENCOUNTER — Ambulatory Visit (INDEPENDENT_AMBULATORY_CARE_PROVIDER_SITE_OTHER): Payer: Commercial Managed Care - HMO | Admitting: Internal Medicine

## 2014-11-09 ENCOUNTER — Encounter: Payer: Self-pay | Admitting: Internal Medicine

## 2014-11-09 VITALS — BP 118/68 | HR 84 | Temp 97.7°F | Resp 16 | Ht 72.0 in | Wt 191.0 lb

## 2014-11-09 DIAGNOSIS — M2560 Stiffness of unspecified joint, not elsewhere classified: Secondary | ICD-10-CM | POA: Diagnosis not present

## 2014-11-09 MED ORDER — DICLOFENAC SODIUM 1 % TD GEL: 2.0000 g | Freq: Three times a day (TID) | TRANSDERMAL | Status: AC | PRN

## 2014-11-09 NOTE — Patient Instructions (Signed)
We have sent in the cream that you can use on your knees for the stiffness. It is called voltaren gel and you can rub it where you have the pain up to 3 times a day.   If this does not work you can always send [place] a message on the computer to let us know and we can always try the pill form.   If you are doing well we can see you back next year for your physical. If you have other problems or questions before then please feel free to let us know.   Health Maintenance A healthy lifestyle and preventative care can promote health and wellness.  Maintain regular health, dental, and eye exams.  Eat a healthy diet. Foods like vegetables, fruits, whole grains, low-fat dairy products, and lean protein foods contain the nutrients you need and are low in calories. Decrease your intake of foods high in solid fats, added sugars, and salt. Get information about a proper diet from your health care provider, if necessary.  Regular physical exercise is one of the most important things you can do for your health. Most adults should get at least 150 minutes of moderate-intensity exercise (any activity that increases your heart rate and causes you to sweat) each week. In addition, most adults need muscle-strengthening exercises on 2 or more days a week.   Maintain a healthy weight. The body mass index (BMI) is a screening tool to identify possible weight problems. It provides an estimate of body fat based on height and weight. Your health care provider can find your BMI and can help you achieve or maintain a healthy weight. For males 20 years and older:  A BMI below 18.5 is considered underweight.  A BMI of 18.5 to 24.9 is normal.  A BMI of 25 to 29.9 is considered overweight.  A BMI of 30 and above is considered obese.  Maintain normal blood lipids and cholesterol by exercising and minimizing your intake of saturated fat. Eat a balanced diet with plenty of fruits and vegetables. Blood tests for lipids and  cholesterol should begin at age 40 and be repeated every 5 years. If your lipid or cholesterol levels are high, you are over age 62, or you are at high risk for heart disease, you may need your cholesterol levels checked more frequently.Ongoing high lipid and cholesterol levels should be treated with medicines if diet and exercise are not working.  If you smoke, find out from your health care provider how to quit. If you do not use tobacco, do not start.  Lung cancer screening is recommended for adults aged 70-80 years who are at high risk for developing lung cancer because of a history of smoking. A yearly low-dose CT scan of the lungs is recommended for people who have at least a 30-pack-year history of smoking and are current smokers or have quit within the past 15 years. A pack year of smoking is smoking an average of 1 pack of cigarettes a day for 1 year (for example, a 30-pack-year history of smoking could mean smoking 1 pack a day for 30 years or 2 packs a day for 15 years). Yearly screening should continue until the smoker has stopped smoking for at least 15 years. Yearly screening should be stopped for people who develop a health problem that would prevent them from having lung cancer treatment.  If you choose to drink alcohol, do not have more than 2 drinks per day. One drink is considered to  be 12 oz (360 mL) of beer, 5 oz (150 mL) of wine, or 1.5 oz (45 mL) of liquor.  Avoid the use of street drugs. Do not share needles with anyone. Ask for help if you need support or instructions about stopping the use of drugs.  High blood pressure causes heart disease and increases the risk of stroke. Blood pressure should be checked at least every 1-2 years. Ongoing high blood pressure should be treated with medicines if weight loss and exercise are not effective.  If you are 69-83 years old, ask your health care provider if you should take aspirin to prevent heart disease.  Diabetes screening involves  taking a blood sample to check your fasting blood sugar level. This should be done once every 3 years after age 45 if you are at a normal weight and without risk factors for diabetes. Testing should be considered at a younger age or be carried out more frequently if you are overweight and have at least 1 risk factor for diabetes.  Colorectal cancer can be detected and often prevented. Most routine colorectal cancer screening begins at the age of 40 and continues through age 28. However, your health care provider may recommend screening at an earlier age if you have risk factors for colon cancer. On a yearly basis, your health care provider may provide home test kits to check for hidden blood in the stool. A small camera at the end of a tube may be used to directly examine the colon (sigmoidoscopy or colonoscopy) to detect the earliest forms of colorectal cancer. Talk to your health care provider about this at age 45 when routine screening begins. A direct exam of the colon should be repeated every 5-10 years through age 23, unless early forms of precancerous polyps or small growths are found.  People who are at an increased risk for hepatitis B should be screened for this virus. You are considered at high risk for hepatitis B if:  You were born in a country where hepatitis B occurs often. Talk with your health care provider about which countries are considered high risk.  Your parents were born in a high-risk country and you have not received a shot to protect against hepatitis B (hepatitis B vaccine).  You have HIV or AIDS.  You use needles to inject street drugs.  You live with, or have sex with, someone who has hepatitis B.  You are a man who has sex with other men (MSM).  You get hemodialysis treatment.  You take certain medicines for conditions like cancer, organ transplantation, and autoimmune conditions.  Hepatitis C blood testing is recommended for all people born from [birth year] through 1964  and any individual with known risk factors for hepatitis C.  Healthy men should no longer receive prostate-specific antigen (PSA) blood tests as part of routine cancer screening. Talk to your health care provider about prostate cancer screening.  Testicular cancer screening is not recommended for adolescents or adult males who have no symptoms. Screening includes self-exam, a health care provider exam, and other screening tests. Consult with your health care provider about any symptoms you have or any concerns you have about testicular cancer.  Practice safe sex. Use condoms and avoid high-risk sexual practices to reduce the spread of sexually transmitted infections (STIs).  You should be screened for STIs, including gonorrhea and chlamydia if:  You are sexually active and are younger than 24 years.  You are older than 24 years, and your health care  provider tells you that you are at risk for this type of infection.  Your sexual activity has changed since you were last screened, and you are at an increased risk for chlamydia or gonorrhea. Ask your health care provider if you are at risk.  If you are at risk of being infected with HIV, it is recommended that you take a prescription medicine daily to prevent HIV infection. This is called pre-exposure prophylaxis (PrEP). You are considered at risk if:  You are a man who has sex with other men (MSM).  You are a heterosexual man who is sexually active with multiple partners.  You take drugs by injection.  You are sexually active with a partner who has HIV.  Talk with your health care provider about whether you are at high risk of being infected with HIV. If you choose to begin PrEP, you should first be tested for HIV. You should then be tested every 3 months for as long as you are taking PrEP.  Use sunscreen. Apply sunscreen liberally and repeatedly throughout the day. You should seek shade when your shadow is shorter than you. Protect  yourself by wearing long sleeves, pants, a wide-brimmed hat, and sunglasses year round whenever you are outdoors.  Tell your health care provider of new moles or changes in moles, especially if there is a change in shape or color. Also, tell your health care provider if a mole is larger than the size of a pencil eraser.  A one-time screening for abdominal aortic aneurysm (AAA) and surgical repair of large AAAs by ultrasound is recommended for men aged 39-75 years who are current or former smokers.  Stay current with your vaccines (immunizations). Document Released: 03/06/2008 Document Revised: 09/13/2013 Document Reviewed: 02/03/2011 [facility] Patient Information 2015 [place], [place]. This information is not intended to replace advice given to you by your health care provider. Make sure you discuss any questions you have with your health care provider.

## 2014-11-09 NOTE — Progress Notes (Signed)
Pre visit review using our clinic review tool, if applicable. No additional management support is needed unless otherwise documented below in the visit note. 

## 2014-11-10 ENCOUNTER — Encounter: Payer: Self-pay | Admitting: Internal Medicine

## 2014-11-10 DIAGNOSIS — M2560 Stiffness of unspecified joint, not elsewhere classified: Secondary | ICD-10-CM | POA: Insufficient documentation

## 2014-11-10 NOTE — Assessment & Plan Note (Signed)
Likely early arthritis and encouraged continued activity. Reassured him that he will not do damage to his joints with this.

## 2014-11-10 NOTE — Progress Notes (Signed)
   Subjective:    Patient ID: Richard Hutchinson, male    DOB: March 12, 1946, 69 y.o.   MRN: [id number]  HPI The patient is a 69 YO man who is coming in for body stiffness in the last year. He is deaf and the visit is translated through interpretor. He states that the stiffness lasts about 10 minutes in the morning and if he sits for more than 20-30 minutes. It has been gradual in onset and 2-3/10 in pain. Once he moves around the stiffness goes away. He has not had to try medicine for it. He does better when he is exercising regularly. No other new complaints or problems. It is in his knees and his hands (which are important for his signing).   Review of Systems  Constitutional: Negative for fever, activity change, appetite change, fatigue and unexpected weight change.  Respiratory: Negative for cough, chest tightness, shortness of breath and wheezing.   Cardiovascular: Negative for chest pain, palpitations and leg swelling.  Gastrointestinal: Negative for abdominal pain, diarrhea, constipation and abdominal distention.  Musculoskeletal: Positive for arthralgias. Negative for myalgias, back pain and gait problem.       Stiffness  Skin: Negative.   Neurological: Negative.   Psychiatric/Behavioral: Negative.       Objective:   Physical Exam  Constitutional: He is oriented to person, place, and time. He appears well-developed and well-nourished.  HENT:  Head: Normocephalic and atraumatic.  Eyes: EOM are normal.  Neck: Normal range of motion.  Cardiovascular: Normal rate and regular rhythm.   Pulmonary/Chest: Effort normal and breath sounds normal. No respiratory distress. He has no wheezes. He has no rales.  Abdominal: Soft. Bowel sounds are normal. He exhibits no distension. There is no tenderness. There is no rebound.  Musculoskeletal: He exhibits no edema.  No swelling or redness of the joints, minimal tenderness to palpation of the left thumb.  Neurological: He is alert and oriented to  person, place, and time. Coordination normal.  Skin: Skin is warm and dry.  Psychiatric: He has a normal mood and affect. His behavior is normal.   Filed Vitals:   11/09/14 1449  BP: 118/68  Pulse: 84  Temp: 97.7 F (36.5 C)  TempSrc: Oral  Resp: 16  Height: 6' (1.829 m)  Weight: 191 lb (86.637 kg)  SpO2: 93%      Assessment & Plan:

## 2014-12-05 DIAGNOSIS — F99 Mental disorder, not otherwise specified: Secondary | ICD-10-CM | POA: Diagnosis not present

## 2014-12-12 DIAGNOSIS — F99 Mental disorder, not otherwise specified: Secondary | ICD-10-CM | POA: Diagnosis not present

## 2015-04-21 IMAGING — CR DG KNEE COMPLETE 4+V*L*
4 series · 4 of 4 positions shown · non-contrast
Comparison: None.

CLINICAL DATA: Injury from cactus to left knee, with persistent
left knee pain and swelling. Assess for foreign body.

EXAM:
LEFT KNEE - COMPLETE 4+ VIEW

[t knee ap left]
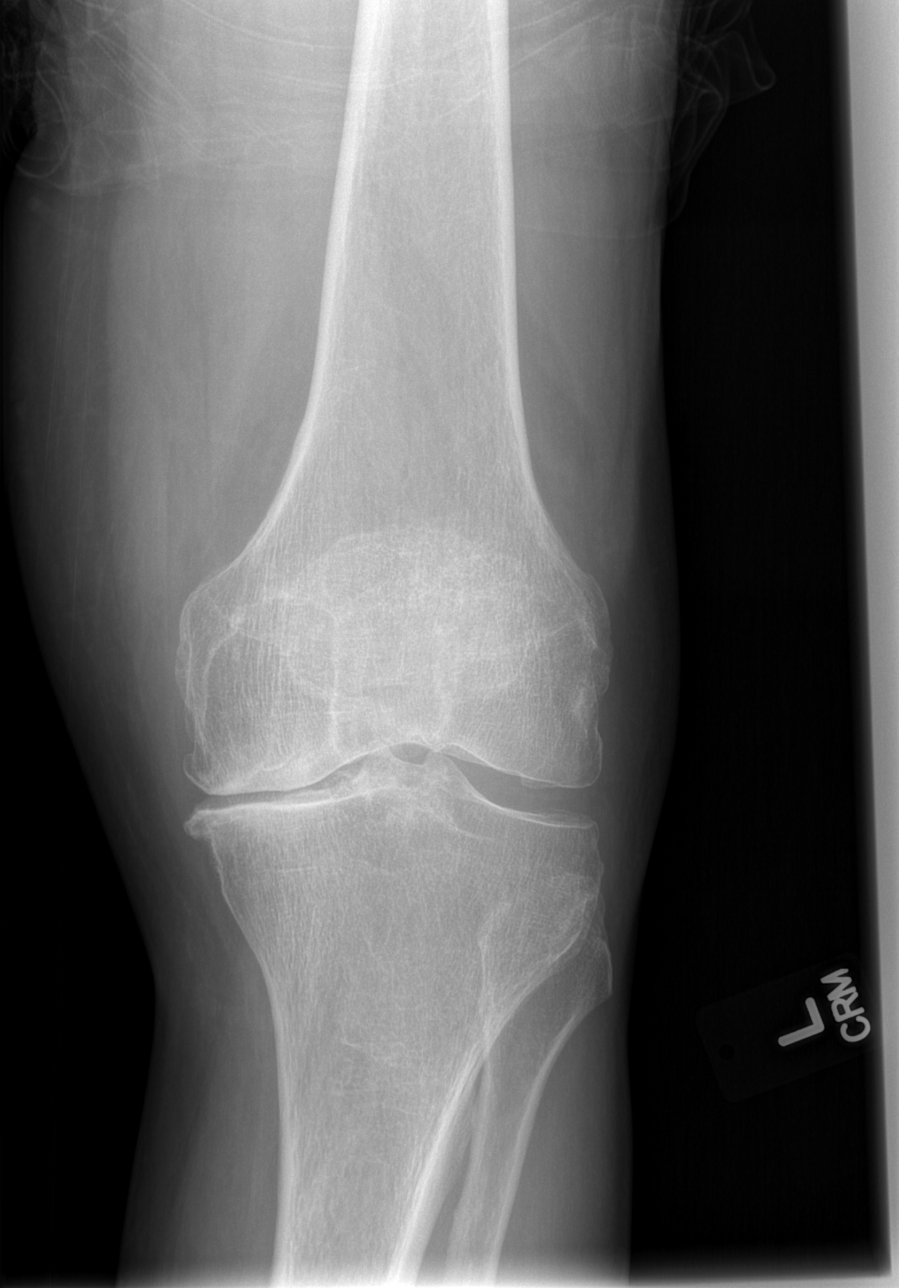

[t knee oblique left (1 of 2)]
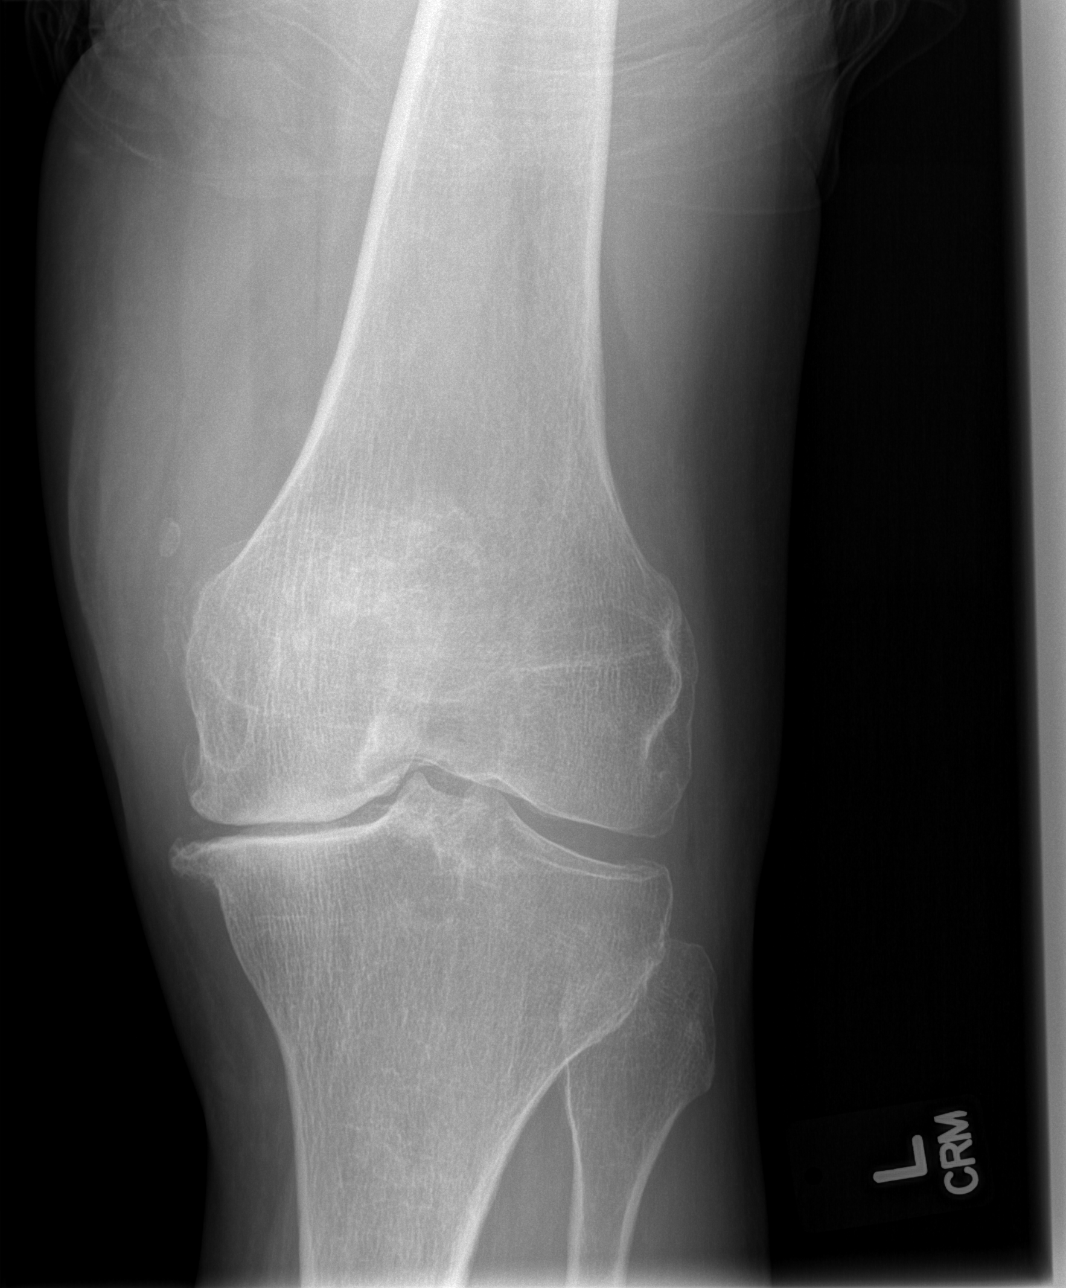

[t knee oblique left (2 of 2)]
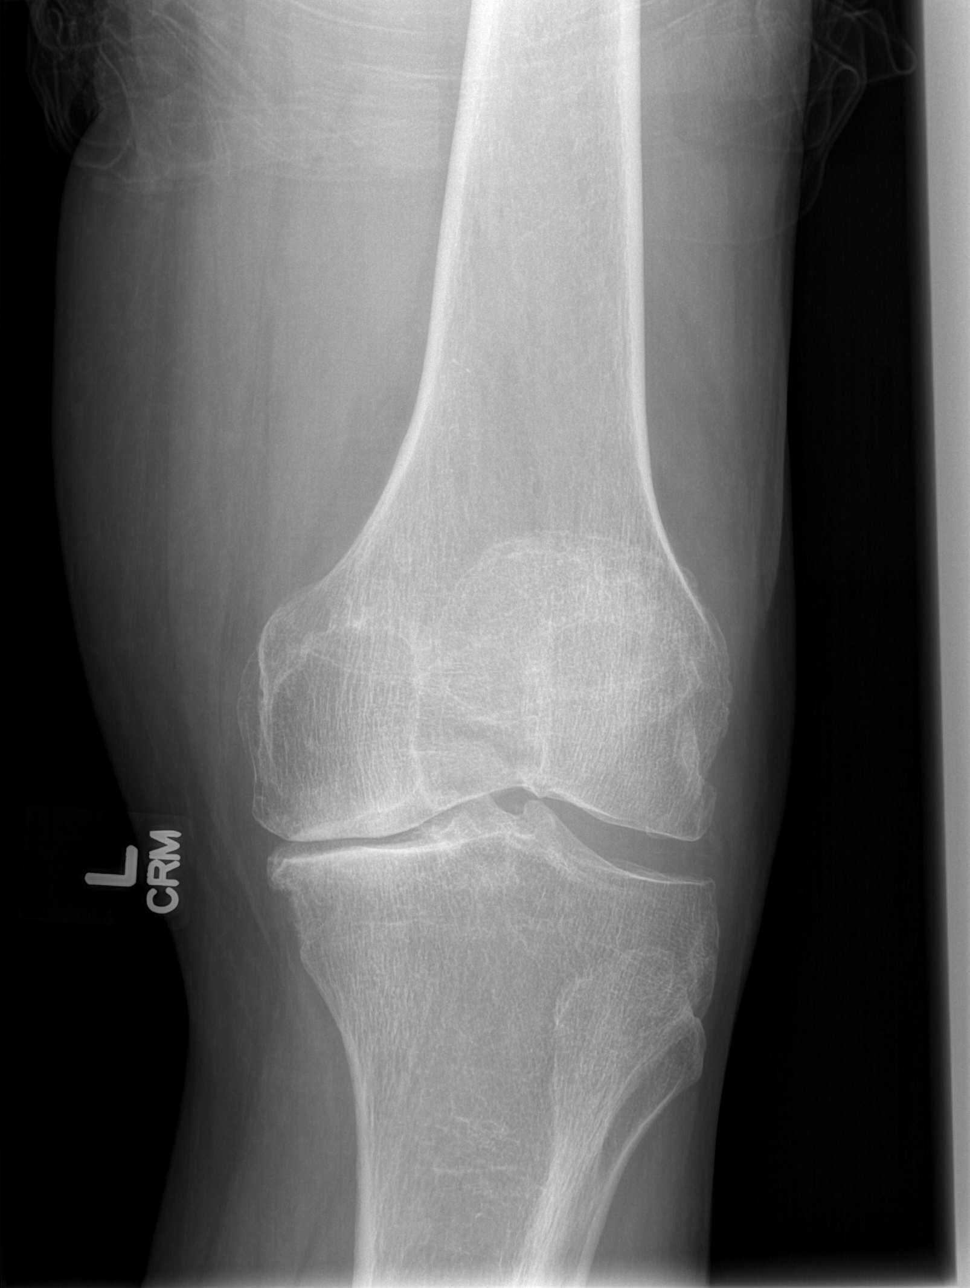

[t knee lat left]
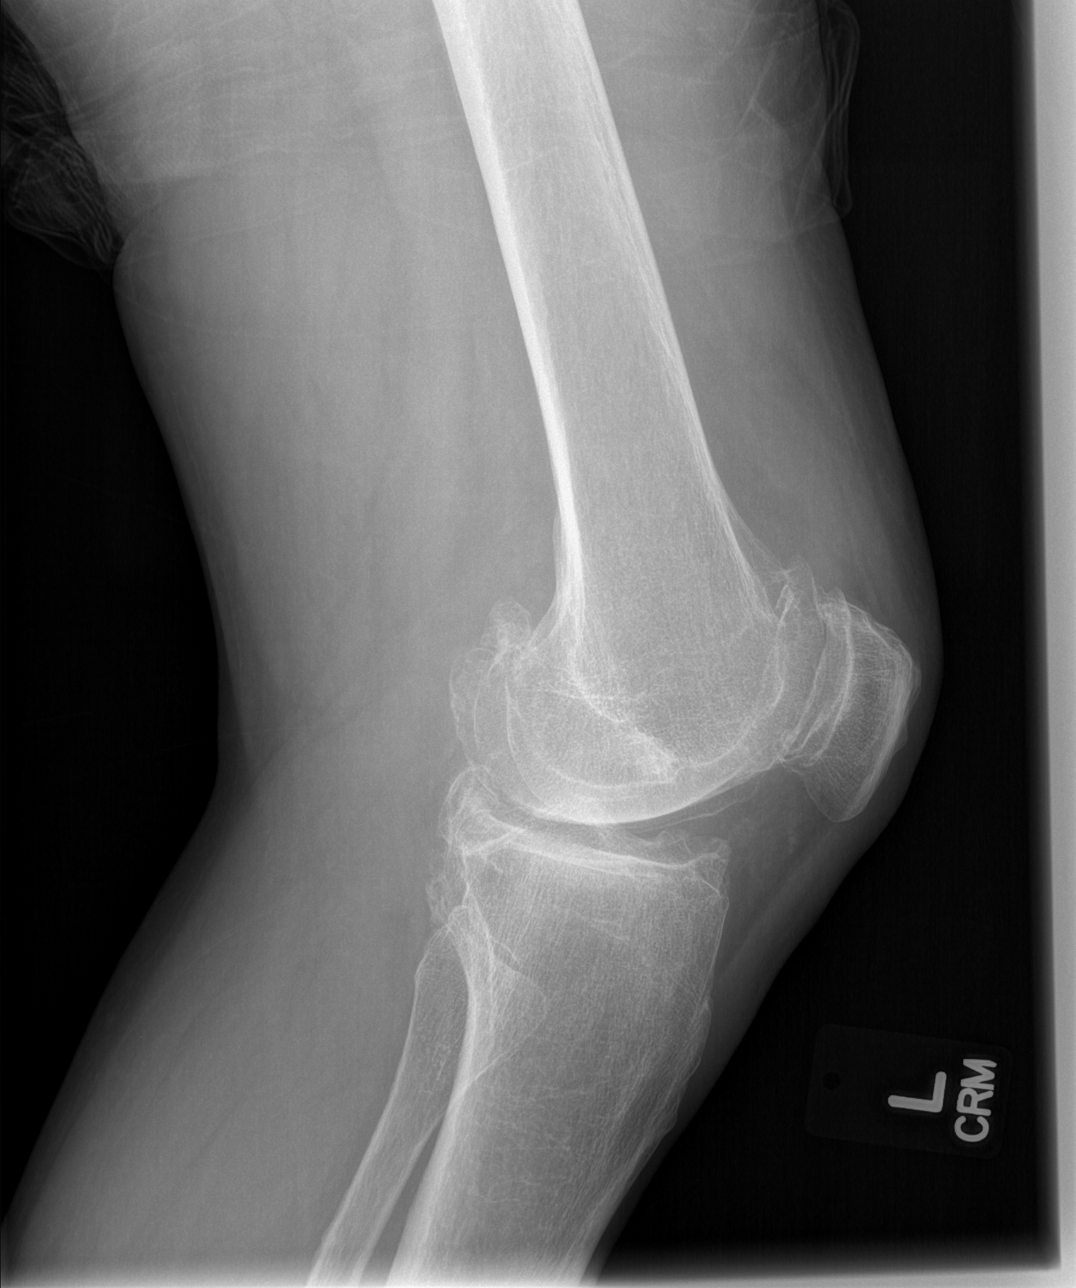

[4 of 4 positions shown; findings below may reference images not displayed]

FINDINGS: There is no evidence of fracture or dislocation. There is mild
medial compartment narrowing. Marginal osteophytes are seen arising
at the medial and patellofemoral compartments. Mild cortical
irregularity is noted along the articular surface of the lateral
femoral condyle.

A small knee joint effusion is seen. No definite radiopaque foreign
body is identified; a cactus needle may not be visible on
radiograph.
IMPRESSION: 1. No evidence of fracture or dislocation.
2. Tricompartmental osteoarthritis noted, with mild narrowing of the
medial compartment.
3. Small knee joint effusion seen.
4. No definite radiopaque foreign body seen; a cactus needle may not
be visible on radiograph.

## 2016-01-07 DIAGNOSIS — I10 Essential (primary) hypertension: Secondary | ICD-10-CM | POA: Diagnosis not present

## 2016-01-07 DIAGNOSIS — S0181XA Laceration without foreign body of other part of head, initial encounter: Secondary | ICD-10-CM | POA: Diagnosis not present

## 2016-09-17 DIAGNOSIS — S5012XA Contusion of left forearm, initial encounter: Secondary | ICD-10-CM | POA: Diagnosis not present

## 2016-10-02 DIAGNOSIS — R079 Chest pain, unspecified: Secondary | ICD-10-CM | POA: Diagnosis not present

## 2016-10-02 DIAGNOSIS — R600 Localized edema: Secondary | ICD-10-CM | POA: Diagnosis not present

## 2016-10-02 DIAGNOSIS — S0990XA Unspecified injury of head, initial encounter: Secondary | ICD-10-CM | POA: Diagnosis not present

## 2016-10-04 DIAGNOSIS — S42292A Other displaced fracture of upper end of left humerus, initial encounter for closed fracture: Secondary | ICD-10-CM | POA: Diagnosis not present

## 2016-10-04 DIAGNOSIS — M79602 Pain in left arm: Secondary | ICD-10-CM | POA: Diagnosis not present

## 2016-10-04 DIAGNOSIS — M25512 Pain in left shoulder: Secondary | ICD-10-CM | POA: Diagnosis not present

## 2016-10-04 DIAGNOSIS — S42202A Unspecified fracture of upper end of left humerus, initial encounter for closed fracture: Secondary | ICD-10-CM | POA: Diagnosis not present

## 2016-10-04 DIAGNOSIS — R0781 Pleurodynia: Secondary | ICD-10-CM | POA: Diagnosis not present

## 2016-10-04 DIAGNOSIS — S42215A Unspecified nondisplaced fracture of surgical neck of left humerus, initial encounter for closed fracture: Secondary | ICD-10-CM | POA: Diagnosis not present

## 2016-10-05 DIAGNOSIS — J705 Respiratory conditions due to smoke inhalation: Secondary | ICD-10-CM | POA: Diagnosis not present

## 2016-10-05 DIAGNOSIS — K920 Hematemesis: Secondary | ICD-10-CM | POA: Diagnosis not present

## 2016-10-05 DIAGNOSIS — J40 Bronchitis, not specified as acute or chronic: Secondary | ICD-10-CM | POA: Diagnosis not present

## 2016-10-05 DIAGNOSIS — R042 Hemoptysis: Secondary | ICD-10-CM | POA: Diagnosis not present

## 2016-10-05 DIAGNOSIS — R030 Elevated blood-pressure reading, without diagnosis of hypertension: Secondary | ICD-10-CM | POA: Diagnosis not present

## 2016-10-16 DIAGNOSIS — M25512 Pain in left shoulder: Secondary | ICD-10-CM | POA: Diagnosis not present

## 2016-10-16 DIAGNOSIS — S42212A Unspecified displaced fracture of surgical neck of left humerus, initial encounter for closed fracture: Secondary | ICD-10-CM | POA: Diagnosis not present

## 2016-10-16 DIAGNOSIS — S42202A Unspecified fracture of upper end of left humerus, initial encounter for closed fracture: Secondary | ICD-10-CM | POA: Diagnosis not present

## 2016-10-22 DIAGNOSIS — M7989 Other specified soft tissue disorders: Secondary | ICD-10-CM | POA: Diagnosis not present

## 2016-10-22 DIAGNOSIS — I10 Essential (primary) hypertension: Secondary | ICD-10-CM | POA: Diagnosis not present

## 2016-10-22 DIAGNOSIS — Z9181 History of falling: Secondary | ICD-10-CM | POA: Diagnosis not present

## 2016-10-22 DIAGNOSIS — R600 Localized edema: Secondary | ICD-10-CM | POA: Diagnosis not present

## 2016-10-22 DIAGNOSIS — Z6826 Body mass index (BMI) 26.0-26.9, adult: Secondary | ICD-10-CM | POA: Diagnosis not present

## 2016-10-22 DIAGNOSIS — S42212S Unspecified displaced fracture of surgical neck of left humerus, sequela: Secondary | ICD-10-CM | POA: Diagnosis not present

## 2016-10-22 DIAGNOSIS — Z1389 Encounter for screening for other disorder: Secondary | ICD-10-CM | POA: Diagnosis not present

## 2016-10-22 DIAGNOSIS — E663 Overweight: Secondary | ICD-10-CM | POA: Diagnosis not present

## 2016-10-25 DIAGNOSIS — R4182 Altered mental status, unspecified: Secondary | ICD-10-CM | POA: Diagnosis not present

## 2016-10-25 DIAGNOSIS — R479 Unspecified speech disturbances: Secondary | ICD-10-CM | POA: Diagnosis not present

## 2016-10-25 DIAGNOSIS — F29 Unspecified psychosis not due to a substance or known physiological condition: Secondary | ICD-10-CM | POA: Diagnosis not present

## 2016-10-25 DIAGNOSIS — F209 Schizophrenia, unspecified: Secondary | ICD-10-CM | POA: Diagnosis not present

## 2016-10-25 DIAGNOSIS — Z79899 Other long term (current) drug therapy: Secondary | ICD-10-CM | POA: Diagnosis not present

## 2016-10-29 DIAGNOSIS — R918 Other nonspecific abnormal finding of lung field: Secondary | ICD-10-CM | POA: Diagnosis not present

## 2016-12-05 ENCOUNTER — Emergency Department (HOSPITAL_COMMUNITY): Payer: Medicare HMO

## 2016-12-05 ENCOUNTER — Encounter (HOSPITAL_COMMUNITY): Payer: Self-pay

## 2016-12-05 DIAGNOSIS — R600 Localized edema: Secondary | ICD-10-CM | POA: Insufficient documentation

## 2016-12-05 DIAGNOSIS — Z79899 Other long term (current) drug therapy: Secondary | ICD-10-CM | POA: Diagnosis not present

## 2016-12-05 DIAGNOSIS — R0689 Other abnormalities of breathing: Secondary | ICD-10-CM | POA: Diagnosis not present

## 2016-12-05 DIAGNOSIS — M7989 Other specified soft tissue disorders: Secondary | ICD-10-CM | POA: Diagnosis present

## 2016-12-05 DIAGNOSIS — R079 Chest pain, unspecified: Secondary | ICD-10-CM | POA: Diagnosis not present

## 2016-12-05 LAB — CBC
HCT: 37.1 % — ABNORMAL LOW (ref 39.0–52.0)
Hemoglobin: 11.8 g/dL — ABNORMAL LOW (ref 13.0–17.0)
MCH: 29.8 pg (ref 26.0–34.0)
MCHC: 31.8 g/dL (ref 30.0–36.0)
MCV: 93.7 fL (ref 78.0–100.0)
PLATELETS: 171 10*3/uL (ref 150–400)
RBC: 3.96 MIL/uL — ABNORMAL LOW (ref 4.22–5.81)
RDW: 14.3 % (ref 11.5–15.5)
WBC: 7.2 10*3/uL (ref 4.0–10.5)

## 2016-12-05 LAB — BASIC METABOLIC PANEL
Anion gap: 7 (ref 5–15)
BUN: 16 mg/dL (ref 6–20)
CALCIUM: 8.5 mg/dL — AB (ref 8.9–10.3)
CO2: 23 mmol/L (ref 22–32)
CREATININE: 1.02 mg/dL (ref 0.61–1.24)
Chloride: 109 mmol/L (ref 101–111)
GFR calc Af Amer: >60 mL/min (ref >60)
GLUCOSE: 109 mg/dL — AB (ref 65–99)
Potassium: 4.1 mmol/L (ref 3.5–5.1)
Sodium: 139 mmol/L (ref 135–145)

## 2016-12-05 LAB — I-STAT TROPONIN, ED: TROPONIN I, POC: 0 ng/mL (ref 0.00–0.08)

## 2016-12-05 LAB — BRAIN NATRIURETIC PEPTIDE: B NATRIURETIC PEPTIDE 5: 17 pg/mL (ref 0.0–100.0)

## 2016-12-05 NOTE — ED Triage Notes (Signed)
Pt is deaf, interpreter used. Pt states that he started having CP today, denies SOB/n/v, pain is L sided. 3/10, no radiation. Pt also c/o of swelling in his bilateral legs that has been going on for the past couple months and states he has decreased his salt intake.

## 2016-12-05 NOTE — ED Notes (Signed)
Pt approached nurse first desk with new complaint of L hand "tightness"; pt flexing and extending fingers, states new pain, never happened before

## 2016-12-06 ENCOUNTER — Emergency Department (HOSPITAL_COMMUNITY)
Admission: EM | Admit: 2016-12-06 | Discharge: 2016-12-06 | Disposition: A | Discharge status: Home / Self Care | Payer: Medicare HMO | Attending: Emergency Medicine | Admitting: Emergency Medicine

## 2016-12-06 DIAGNOSIS — R609 Edema, unspecified: Secondary | ICD-10-CM

## 2016-12-06 HISTORY — DX: Unspecified hearing loss, unspecified ear: H91.90

## 2016-12-06 MED ORDER — FUROSEMIDE 20 MG PO TABS: 20.0000 mg | ORAL_TABLET | Freq: Every day | ORAL | 0 refills | Status: AC

## 2016-12-06 NOTE — ED Provider Notes (Signed)
[facility] DEPT Provider Note   CSN: [id number] Arrival date & time: 12/05/16  2033  By signing my name below, I, Dolores Hoose, attest that this documentation has been prepared under the direction and in the presence of Dorie Rank, MD . Electronically Signed: Dolores Hoose, Scribe. 12/06/2016. 1:08 AM.  History   Chief Complaint Chief Complaint  Patient presents with  . Chest Pain   The history is provided by the patient. A language interpreter was used.    HPI Comments:  Leonides Minder is a 71 y.o. male who presents to the Emergency Department complaining of intermittent, chronic LE swelling which began 2 months ago. Pt reports associated left-shoulder pain and "lumps" on his left side, cheek, neck and left leg. He notes that his leg swelling is typically caused by sodium intake, but the appearance of his "lumps" caused him to come to the ED today. No modifying factors indicated. He denies any SOB, CP, nausea or vomiting.   Past Medical History:  Diagnosis Date  . Arthritis   . Deaf     Patient Active Problem List   Diagnosis Date Noted  . Joint stiffness 11/10/2014  . 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 Psoriasis 10/05/2013  . History of back injury 10/05/2013    Past Surgical History:  Procedure Laterality Date  . FRACTURE SURGERY      Home Medications    Prior to Admission medications   Medication Sig Start Date End Date Taking? Authorizing Provider  clotrimazole-betamethasone (LOTRISONE) cream Apply 1 application topically 2 (two) times daily. Patient not taking: Reported on 11/09/2014 01/02/14   Kennyth Arnold, FNP  diclofenac sodium (VOLTAREN) 1 % GEL Apply 2 g topically 3 (three) times daily as needed. 11/09/14   Hoyt Koch, MD  furosemide (LASIX) 20 MG tablet Take 1 tablet (20 mg total) by mouth daily. 12/06/16   Dorie Rank, MD  risperiDONE (RISPERDAL) 2 MG tablet at bedtime.  05/22/14   Historical Provider, MD    Family  History Family History  Problem Relation Age of Onset  . Family history unknown: Yes    Social History Social History  Substance Use Topics  . Smoking status: Never Smoker  . Smokeless tobacco: Never Used  . Alcohol use Yes     Allergies   Patient has no known allergies.   Review of Systems Review of Systems  Respiratory: Negative for shortness of breath.   Cardiovascular: Positive for leg swelling. Negative for chest pain.  Gastrointestinal: Negative for nausea and vomiting.  All other systems reviewed and are negative.  Physical Exam Updated Vital Signs BP (!) 153/101 (BP Location: Right Arm)   Pulse 73   Temp 98 F (36.7 C) (Oral)   Resp 11   Ht 6' (1.829 m)   Wt 90.7 kg   SpO2 100%   BMI 27.12 kg/m   Physical Exam  Constitutional: He appears well-developed and well-nourished. No distress.  HENT:  Head: Normocephalic and atraumatic.  Right Ear: External ear normal.  Left Ear: External ear normal.  Eyes: Conjunctivae are normal. Right eye exhibits no discharge. Left eye exhibits no discharge. No scleral icterus.  Neck: Neck supple. No tracheal deviation present.  Cardiovascular: Normal rate, regular rhythm and intact distal pulses.   Pulmonary/Chest: Effort normal and breath sounds normal. No stridor. No respiratory distress. He has no wheezes. He has no rales.  Abdominal: Soft. Bowel sounds are normal. He exhibits no distension. There is no tenderness. There is no  rebound and no guarding.  Musculoskeletal: He exhibits edema and tenderness.  Mild tenderness to palpation to proximal left humerus. Bilateral pitting edema to lower extremities, below the knees.   Neurological: He is alert. He has normal strength. No cranial nerve deficit (no facial droop, extraocular movements intact, no slurred speech) or sensory deficit. He exhibits normal muscle tone. He displays no seizure activity. Coordination normal.  Skin: Skin is warm and dry. No rash noted.  No lumps or  masses noted on skin   Psychiatric: He has a normal mood and affect.  Nursing note and vitals reviewed.    ED Treatments / Results  DIAGNOSTIC STUDIES:  Oxygen Saturation is 100% on RA, normal by my interpretation.    COORDINATION OF CARE:  1:24 AM Discussed treatment plan with pt at bedside which includes diuretic medication and pt agreed to plan.  Labs (all labs ordered are listed, but only abnormal results are displayed) Labs Reviewed  BASIC METABOLIC PANEL - Abnormal; Notable for the following:       Result Value   Glucose, Bld 109 (*)    Calcium 8.5 (*)    All other components within normal limits  CBC - Abnormal; Notable for the following:    RBC 3.96 (*)    Hemoglobin 11.8 (*)    HCT 37.1 (*)    All other components within normal limits  BRAIN NATRIURETIC PEPTIDE  I-STAT TROPOININ, ED    EKG  EKG Interpretation None       Radiology Dg Chest 2 View  Result Date: 12/05/2016 CLINICAL DATA:  70 year old male with chest pain. EXAM: CHEST  2 VIEW COMPARISON:  None. FINDINGS: The lungs are clear. There is no pleural effusion or pneumothorax. The cardiac silhouette is within normal limits. There is osteopenia with degenerative changes of the spine. Left clavicular fixation plate and screws noted. Partially visualized age indeterminate fracture of the left humeral neck. Clinical correlation is recommended. Dedicated radiograph of the left humerus may provide better evaluation if there is clinical concern for acute fracture. IMPRESSION: 1. No acute cardiopulmonary process. 2. Partially visualized age indeterminate fracture of the left humeral neck, likely subacute or chronic. Correlation with clinical exam recommended. Dedicated radiographs of the left humerus may provide better evaluation if there is clinical concern for acute fracture. Electronically Signed   By: Anner Crete M.D.   On: 12/05/2016 22:02    Procedures Procedures (including critical care  time)  Medications Ordered in ED Medications - No data to display   Initial Impression / Assessment and Plan / ED Course  I have reviewed the triage vital signs and the nursing notes.  Pertinent labs & imaging results that were available during my care of the patient were reviewed by me and considered in my medical decision making (see chart for details).   patient presents to emergency room with complaints of peripheral edema. He denies having any trouble with any chest pain or shortness of breath. ED evaluation is reassuring.  No signs of congestive heart failure. I doubt acute coronary syndrome.  Plan on discharge home with Lasix. Follow-up with his primary care doctor.  Incidental old humerus fracture is noted. Patient is aware of this injury  Final Clinical Impressions(s) / ED Diagnoses   Final diagnoses:  Peripheral edema    New Prescriptions New Prescriptions   FUROSEMIDE (LASIX) 20 MG TABLET    Take 1 tablet (20 mg total) by mouth daily.  I personally performed the services described in this documentation,  which was scribed in my presence.  The recorded information has been reviewed and is accurate.     Dorie Rank, MD 12/06/16 [phone]

## 2016-12-06 NOTE — ED Notes (Signed)
Discharge instructions and prescription reviewed.  Instructed patient that the Lasix is on the $4 list at [facility] and patient stated he is broke since he paid all his bills

## 2016-12-06 NOTE — ED Notes (Signed)
Patient states he had a pain to the left side that is no longer there.  Pedal edema right lower ext 2+ and left lower ext 1+.  Lungs clear.  Denies any CP or discomfort at this time.

## 2016-12-06 NOTE — ED Notes (Signed)
Pt found sleeping in waiting room

## 2016-12-06 NOTE — ED Notes (Signed)
Spoke with Dr Tomi Bamberger and he is willing to give a dose of Lasix tonight.

## 2016-12-06 NOTE — ED Notes (Signed)
Pt requested something to drink ok per RN Pamala Hurry. Pt given sprite per requested. Pt also given 2 pks of graham crackers and peanut butter

## 2016-12-06 NOTE — ED Notes (Signed)
Dr Tomi Bamberger in to see patient with the use of the int. Machine.

## 2016-12-06 NOTE — ED Notes (Signed)
No answer for re-assessment.

## 2016-12-06 NOTE — Discharge Instructions (Signed)
Follow up with your primary care doctor, use the diuretic medication in the morning to help with the swelling, you will notice you will urinate more after taking it

## 2017-01-22 DIAGNOSIS — I10 Essential (primary) hypertension: Secondary | ICD-10-CM | POA: Diagnosis not present

## 2017-01-22 DIAGNOSIS — S148XXA Injury of other specified nerves of neck, initial encounter: Secondary | ICD-10-CM | POA: Diagnosis not present

## 2017-01-22 DIAGNOSIS — T148XXA Other injury of unspecified body region, initial encounter: Secondary | ICD-10-CM | POA: Diagnosis not present

## 2017-01-22 DIAGNOSIS — Z01810 Encounter for preprocedural cardiovascular examination: Secondary | ICD-10-CM | POA: Diagnosis not present

## 2017-01-22 DIAGNOSIS — R030 Elevated blood-pressure reading, without diagnosis of hypertension: Secondary | ICD-10-CM | POA: Diagnosis not present

## 2017-01-22 DIAGNOSIS — S72002D Fracture of unspecified part of neck of left femur, subsequent encounter for closed fracture with routine healing: Secondary | ICD-10-CM | POA: Diagnosis not present

## 2017-01-22 DIAGNOSIS — J9811 Atelectasis: Secondary | ICD-10-CM | POA: Diagnosis not present

## 2017-01-22 DIAGNOSIS — T148XXD Other injury of unspecified body region, subsequent encounter: Secondary | ICD-10-CM | POA: Diagnosis not present

## 2017-01-22 DIAGNOSIS — M810 Age-related osteoporosis without current pathological fracture: Secondary | ICD-10-CM | POA: Diagnosis not present

## 2017-01-22 DIAGNOSIS — S72002A Fracture of unspecified part of neck of left femur, initial encounter for closed fracture: Secondary | ICD-10-CM | POA: Diagnosis not present

## 2017-01-22 DIAGNOSIS — D72828 Other elevated white blood cell count: Secondary | ICD-10-CM | POA: Diagnosis not present

## 2017-01-22 DIAGNOSIS — H9190 Unspecified hearing loss, unspecified ear: Secondary | ICD-10-CM | POA: Diagnosis not present

## 2017-01-22 DIAGNOSIS — S79912A Unspecified injury of left hip, initial encounter: Secondary | ICD-10-CM | POA: Diagnosis not present

## 2017-01-22 DIAGNOSIS — D696 Thrombocytopenia, unspecified: Secondary | ICD-10-CM | POA: Diagnosis not present

## 2017-01-22 DIAGNOSIS — S72142A Displaced intertrochanteric fracture of left femur, initial encounter for closed fracture: Secondary | ICD-10-CM | POA: Diagnosis not present

## 2017-01-22 DIAGNOSIS — S72145A Nondisplaced intertrochanteric fracture of left femur, initial encounter for closed fracture: Secondary | ICD-10-CM | POA: Diagnosis not present

## 2017-01-22 DIAGNOSIS — R509 Fever, unspecified: Secondary | ICD-10-CM | POA: Diagnosis not present

## 2017-01-22 DIAGNOSIS — K219 Gastro-esophageal reflux disease without esophagitis: Secondary | ICD-10-CM | POA: Diagnosis not present

## 2017-01-22 DIAGNOSIS — F17290 Nicotine dependence, other tobacco product, uncomplicated: Secondary | ICD-10-CM | POA: Diagnosis not present

## 2017-01-22 DIAGNOSIS — M25552 Pain in left hip: Secondary | ICD-10-CM | POA: Diagnosis not present

## 2017-01-27 DIAGNOSIS — K219 Gastro-esophageal reflux disease without esophagitis: Secondary | ICD-10-CM | POA: Diagnosis not present

## 2017-01-27 DIAGNOSIS — S148XXA Injury of other specified nerves of neck, initial encounter: Secondary | ICD-10-CM | POA: Diagnosis not present

## 2017-01-27 DIAGNOSIS — D696 Thrombocytopenia, unspecified: Secondary | ICD-10-CM | POA: Diagnosis not present

## 2017-01-27 DIAGNOSIS — S72002A Fracture of unspecified part of neck of left femur, initial encounter for closed fracture: Secondary | ICD-10-CM | POA: Diagnosis not present

## 2017-01-27 DIAGNOSIS — T148XXA Other injury of unspecified body region, initial encounter: Secondary | ICD-10-CM | POA: Diagnosis not present

## 2017-01-27 DIAGNOSIS — D72828 Other elevated white blood cell count: Secondary | ICD-10-CM | POA: Diagnosis not present

## 2017-01-27 DIAGNOSIS — R509 Fever, unspecified: Secondary | ICD-10-CM | POA: Diagnosis not present

## 2017-01-27 DIAGNOSIS — H9190 Unspecified hearing loss, unspecified ear: Secondary | ICD-10-CM | POA: Diagnosis not present

## 2017-01-27 DIAGNOSIS — I10 Essential (primary) hypertension: Secondary | ICD-10-CM | POA: Diagnosis not present

## 2017-01-27 DIAGNOSIS — M439 Deforming dorsopathy, unspecified: Secondary | ICD-10-CM | POA: Diagnosis not present

## 2017-01-27 DIAGNOSIS — R262 Difficulty in walking, not elsewhere classified: Secondary | ICD-10-CM | POA: Diagnosis not present

## 2017-01-27 DIAGNOSIS — F17290 Nicotine dependence, other tobacco product, uncomplicated: Secondary | ICD-10-CM | POA: Diagnosis not present

## 2017-01-27 DIAGNOSIS — T148XXD Other injury of unspecified body region, subsequent encounter: Secondary | ICD-10-CM | POA: Diagnosis not present

## 2017-01-27 DIAGNOSIS — R030 Elevated blood-pressure reading, without diagnosis of hypertension: Secondary | ICD-10-CM | POA: Diagnosis not present

## 2017-01-27 DIAGNOSIS — J9811 Atelectasis: Secondary | ICD-10-CM | POA: Diagnosis not present

## 2017-01-27 DIAGNOSIS — S72002D Fracture of unspecified part of neck of left femur, subsequent encounter for closed fracture with routine healing: Secondary | ICD-10-CM | POA: Diagnosis not present

## 2017-01-27 DIAGNOSIS — D649 Anemia, unspecified: Secondary | ICD-10-CM | POA: Diagnosis not present

## 2017-01-27 DIAGNOSIS — G8918 Other acute postprocedural pain: Secondary | ICD-10-CM | POA: Diagnosis not present

## 2017-02-02 DIAGNOSIS — R262 Difficulty in walking, not elsewhere classified: Secondary | ICD-10-CM | POA: Diagnosis not present

## 2017-02-02 DIAGNOSIS — M439 Deforming dorsopathy, unspecified: Secondary | ICD-10-CM | POA: Diagnosis not present

## 2017-02-02 DIAGNOSIS — D649 Anemia, unspecified: Secondary | ICD-10-CM | POA: Diagnosis not present

## 2017-02-02 DIAGNOSIS — G8918 Other acute postprocedural pain: Secondary | ICD-10-CM | POA: Diagnosis not present

## 2017-02-11 ENCOUNTER — Other Ambulatory Visit: Payer: Self-pay

## 2017-02-11 NOTE — Patient Outreach (Signed)
[facility]) Care Management  02/11/2017  Richard Hutchinson 08/20/1946 [id number]  Transition of care  Week # 1 Referral date: 02/09/17 Referral source: status post [facility] nursing home discharge Program Transition of care Insurance: [employer]  Providers: Social support   SUBJECTIVE: Telephone call to patient for transition of care follow up. Unable to reach patient. HIPAA compliant voice message left with call back phone number.  PLAN: RNCM will attempt 2nd telephone call to patient within 3 business days.   Quinn Plowman RN,BSN,CCM [facility] Telephonic  [phone]

## 2017-02-13 ENCOUNTER — Other Ambulatory Visit: Payer: Self-pay

## 2017-02-13 NOTE — Telephone Encounter (Signed)
This encounter was created in error - please disregard.

## 2017-02-13 NOTE — Patient Outreach (Signed)
[name]) Care Management  02/13/2017  Richard Hutchinson 08-28-1946 [id number]  Transition of care  Week # 1 Referral date: 02/09/17 Referral source: status post [facility] nursing home discharge Program Transition of care Insurance: [employer]  Providers: Social support Attempt #2   SUBJECTIVE: Telephone call to patient for transition of care follow up. Unable to leave voice message.   PLAN: RNCM will attempt 3rd telephone call to patient within 3 business days.   Quinn Plowman RN,BSN,CCM [facility] Telephonic  [phone]

## 2017-02-17 ENCOUNTER — Other Ambulatory Visit: Payer: Self-pay

## 2017-02-17 NOTE — Patient Outreach (Signed)
[facility]) Care Management  02/17/2017  Richard Hutchinson Sep 14, 1946 [id number]  Transition of care  Week #1 Referral date: 02/09/17 Referral source: status post [facility] nursing home discharge Program Transition of care Insurance: [employer] Providers: Social support Attempt #3   SUBJECTIVE: Telephone call to patient for transition of care follow up. Attempted alternate listed number. Contact answering phone states she and patient are separated and patient is deaf therefore unable to reach him by phone. No message left with contact.   PLAN: RNCM will send patient outreach letter to attempt contact.   Quinn Plowman RN,BSN,CCM [facility] Telephonic  [phone]

## 2017-02-27 ENCOUNTER — Other Ambulatory Visit: Payer: Self-pay

## 2017-02-27 NOTE — Patient Outreach (Signed)
[facility]) Care Management  02/27/2017  Richard Hutchinson 09/01/1946 [id number]   Transition of care  Week #1 Referral date: 02/09/17 Referral source: status post [facility] nursing home discharge Program Transition of care Insurance: [employer] Providers: Social support   [facility] outreach letter returned to office. RNCM contacted patients primary MD office to verify patients mailing address.  Spoke with Tammy at Dr. Nathanial Millman office who confirmed mailing address in patients chart. Tammy informed that patient would be closed to [facility] services at this time due to being unable to reach.   Unable to reach patient by outreach letter.  PLAN; RNCM will refer patient to care management assistant to close due to being unable to reach patient.  RNCM notified patients primary MD office of closure.   Quinn Plowman RN,BSN,CCM [facility] Telephonic  [phone]

## 2017-03-03 ENCOUNTER — Other Ambulatory Visit: Payer: Self-pay

## 2017-03-03 NOTE — Patient Outreach (Signed)
[facility]) Care Management  03/03/2017  Richard Hutchinson 1946/09/14 [id number]  Case closure: No response from patient after 3 telephone calls and letter outreach.   PLAN;  RNCM will refer patient to care management assistant to close due to being unable to contact. RNCM will notify patients primary MD of closure.   Quinn Plowman RN,BSN,CCM [facility] Telephonic  [phone]

## 2018-03-31 IMAGING — DX DG CHEST 2V
2 series · 2 of 2 positions shown · non-contrast
Comparison: None.

CLINICAL DATA: 71-year-old male with chest pain.

EXAM:
CHEST  2 VIEW

[w chest pa]
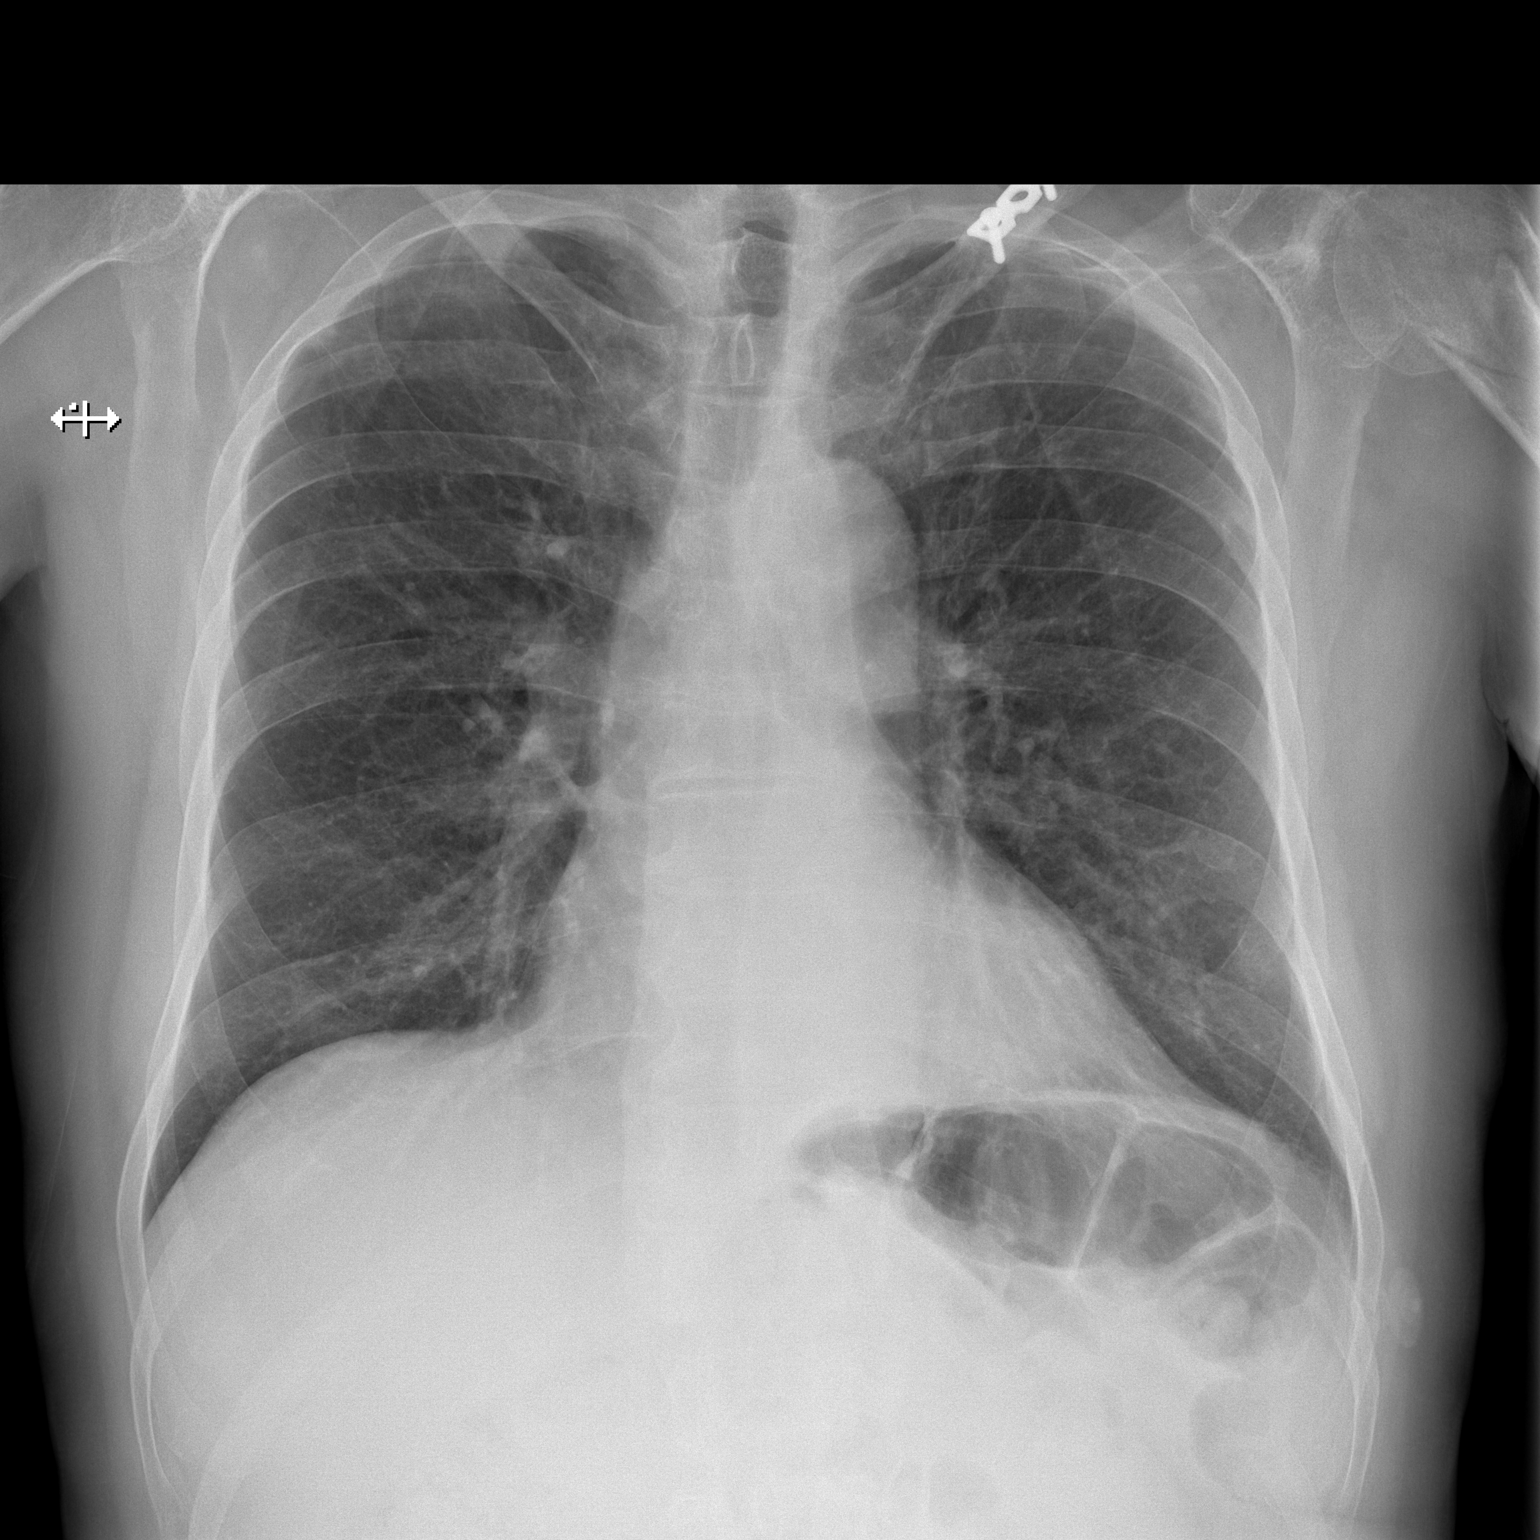

[w chest lat]
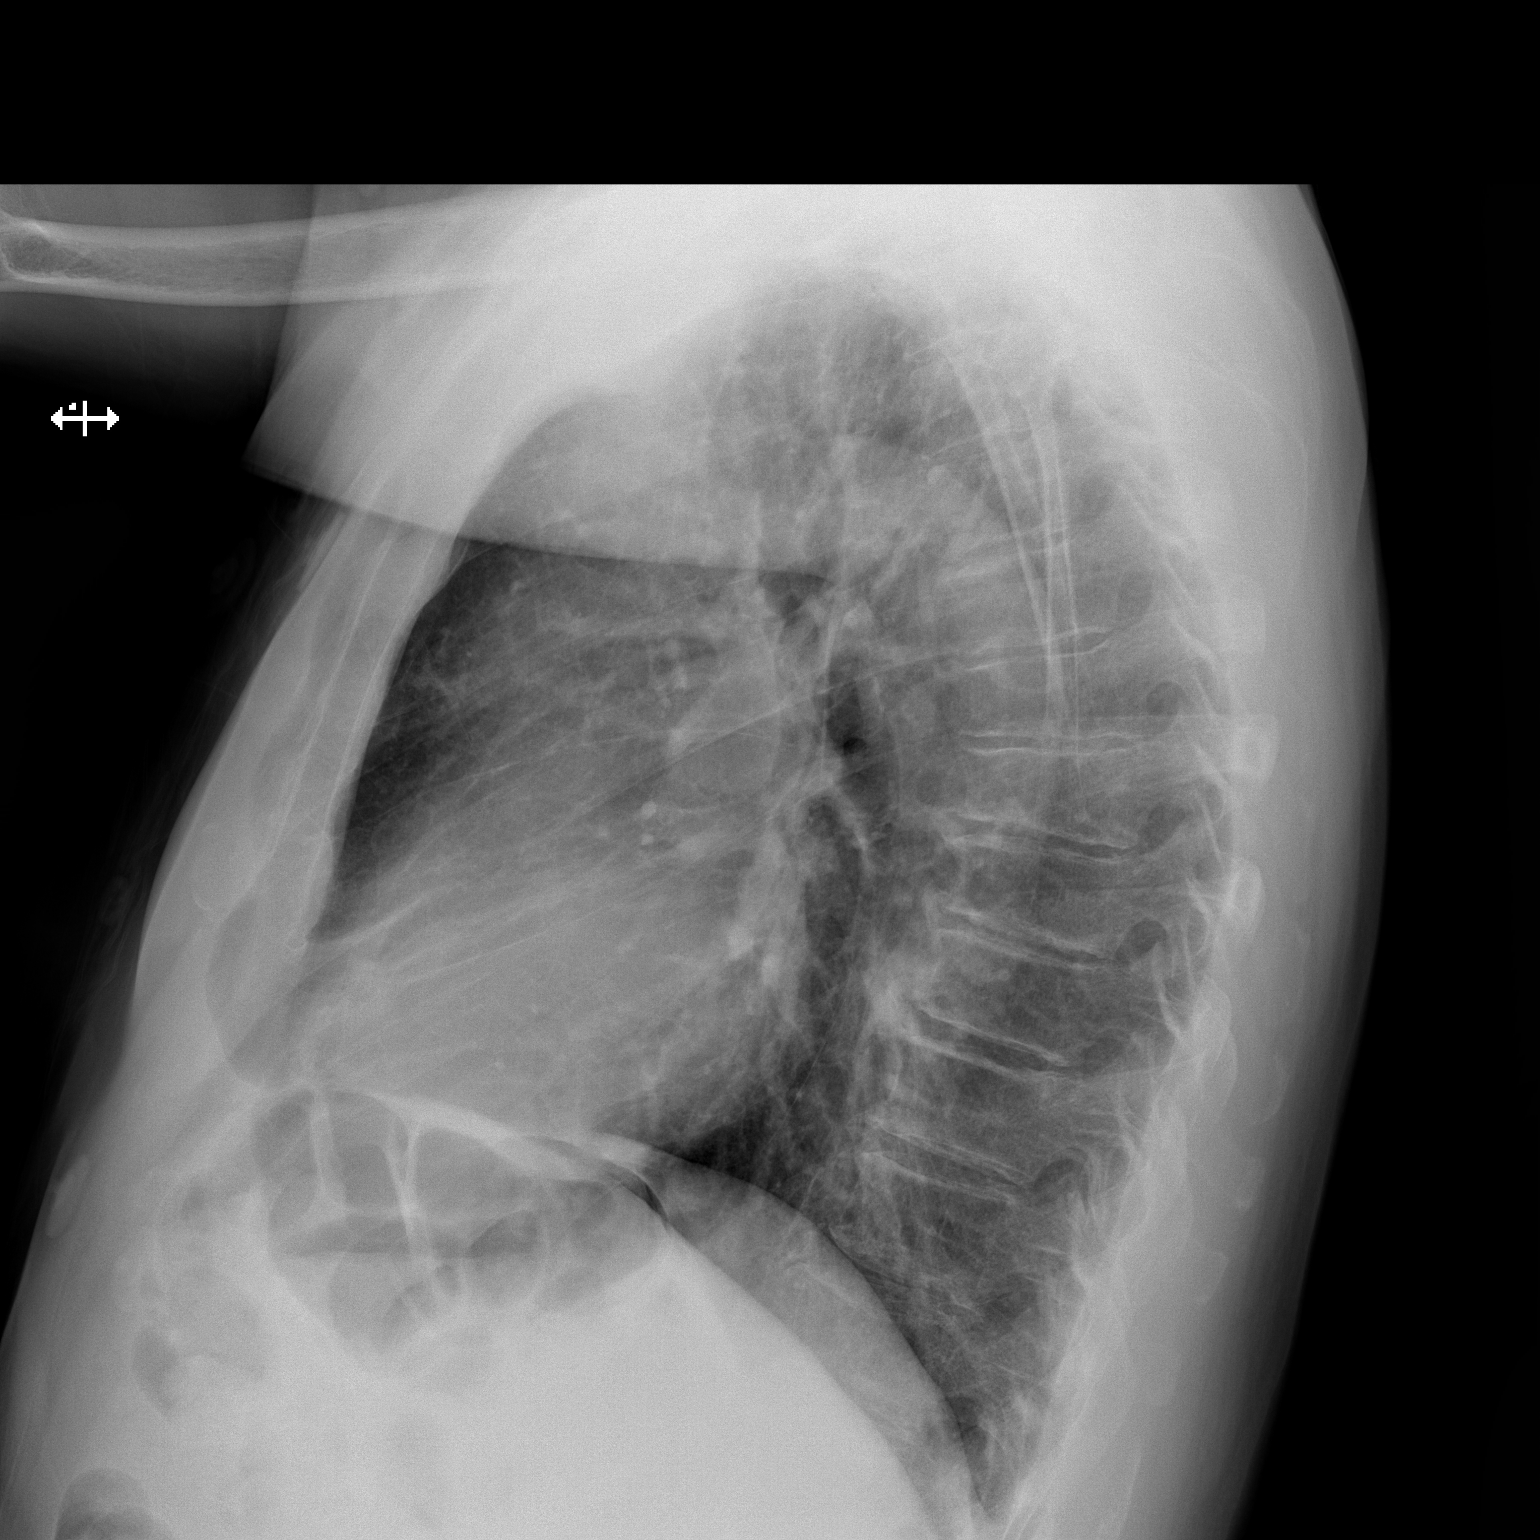

[2 of 2 positions shown; findings below may reference images not displayed]

FINDINGS: The lungs are clear. There is no pleural effusion or pneumothorax.
The cardiac silhouette is within normal limits. There is osteopenia
with degenerative changes of the spine. Left clavicular fixation
plate and screws noted. Partially visualized age indeterminate
fracture of the left humeral neck. Clinical correlation is
recommended. Dedicated radiograph of the left humerus may provide
better evaluation if there is clinical concern for acute fracture.
IMPRESSION: 1. No acute cardiopulmonary process.
2. Partially visualized age indeterminate fracture of the left
humeral neck, likely subacute or chronic. Correlation with clinical
exam recommended. Dedicated radiographs of the left humerus may
provide better evaluation if there is clinical concern for acute
fracture.

## 2021-11-06 ENCOUNTER — Emergency Department: Admit: 2021-11-06 | Payer: MEDICARE

## 2021-11-06 ENCOUNTER — Encounter

## 2021-11-06 ENCOUNTER — Inpatient Hospital Stay
Admission: EM | Admit: 2021-11-06 | Discharge: 2021-11-06 | Disposition: A | Discharge status: Home / Self Care | Payer: MEDICARE | Admitting: Emergency Medicine

## 2021-11-06 ENCOUNTER — Encounter (HOSPITAL_BASED_OUTPATIENT_CLINIC_OR_DEPARTMENT_OTHER)

## 2021-11-06 DIAGNOSIS — S0990XD Unspecified injury of head, subsequent encounter: Secondary | ICD-10-CM

## 2021-11-06 LAB — CBC WITH DIFFERENTIAL
Basophils %: 0.2 %
Basophils Absolute: 0.02 10*3/uL (ref 0.00–0.22)
Eosinophils %: 0.7 %
Eosinophils Absolute: 0.08 10*3/uL (ref 0.00–0.50)
Hematocrit: 28.7 % — ABNORMAL LOW (ref 37.0–53.0)
Hemoglobin: 9.6 g/dL — ABNORMAL LOW (ref 13.0–17.5)
Immature Granulocytes %: 0.8 %
Immature Granulocytes Absolute: 0.09 10*3/uL (ref 0.00–0.10)
Lymphocyte %: 7.2 %
Lymphocytes Absolute: 0.86 10*3/uL (ref 0.70–4.00)
MCH: 31.6 pg (ref 26.0–34.0)
MCHC: 33.4 g/dL (ref 31.0–37.0)
MCV: 94.4 fL (ref 80.0–100.0)
MPV: 9.6 fL (ref 9.1–12.4)
Monocytes %: 7.4 %
Monocytes Absolute: 0.88 10*3/uL — ABNORMAL HIGH (ref 0.38–0.83)
NRBC %: 0 % (ref 0.0–0.0)
NRBC Absolute: 0 10*3/uL (ref 0.00–2.00)
Neutrophil %: 83.7 %
Neutrophils Absolute: 9.98 10*3/uL — ABNORMAL HIGH (ref 1.50–7.95)
Platelets: 187 10*3/uL (ref 150–400)
RBC: 3.04 M/uL — ABNORMAL LOW (ref 4.20–5.90)
RDW-CV: 12.7 % (ref 11.5–14.5)
RDW-SD: 43.7 fL (ref 35.0–51.0)
WBC: 11.9 10*3/uL — ABNORMAL HIGH (ref 4.0–11.0)

## 2021-11-06 LAB — COMPREHENSIVE METABOLIC PANEL
ALT: 27 U/L (ref 0–55)
AST: 35 U/L (ref 6–42)
Albumin: 2.7 g/dL — ABNORMAL LOW (ref 3.2–5.0)
Alkaline phosphatase: 115 U/L (ref 30–130)
Anion Gap: 11 mmol/L (ref 3–14)
BUN: 29 mg/dL — ABNORMAL HIGH (ref 6–24)
Bilirubin, total: 0.7 mg/dL (ref 0.2–1.2)
CO2 (Bicarbonate): 22 mmol/L (ref 20–32)
Calcium: 8.5 mg/dL (ref 8.5–10.5)
Chloride: 107 mmol/L (ref 98–110)
Creatinine: 1.16 mg/dL (ref 0.55–1.30)
Glucose: 126 mg/dL (ref 70–139)
Potassium: 3.3 mmol/L — ABNORMAL LOW (ref 3.6–5.2)
Protein, total: 6 g/dL (ref 6.0–8.4)
Sodium: 140 mmol/L (ref 135–146)
eGFRcr: 66 mL/min/{1.73_m2} (ref >=60)

## 2021-11-06 LAB — SDS, SERUM DRUG SCREEN
Acetaminophen level: <5 ug/mL (ref <=30)
Benzodiazepines screen, serum: NOT DETECTED
Ethanol level, plasma/serum: <10 mg/dL (ref <=10)
Salicylate level: <0.5 mg/dL (ref 0.0–30.0)
Tricyclics screen, serum: NOT DETECTED mg/dL

## 2021-11-06 MED ORDER — potassium chloride CR (Klor-Con M20) ER tablet 40 mEq
20 | Freq: Once | ORAL | Status: AC
Start: 2021-11-06 — End: 2021-11-06
  Administered 2021-11-06: 09:00:00 40 meq via ORAL

## 2021-11-06 MED FILL — POTASSIUM CHLORIDE ER 20 MEQ TABLET,EXTENDED RELEASE(PART/CRYST): 20 20 mEq | ORAL | Qty: 2

## 2021-11-06 NOTE — ED Triage Notes (Signed)
Pt brought in from street. Pt deaf. Pt reports 3 days ago had a mechanical fall with head strike and he was eval after. Pt reports ongoing headache.

## 2021-11-06 NOTE — ED Notes (Signed)
Pt given sandwich and water.      Arnell Asal, RN  11/06/21 [phone]

## 2021-11-06 NOTE — Discharge Instructions (Signed)
Please contact your doctor today    Return quickly if your symptoms worsen

## 2021-11-06 NOTE — ED Provider Notes (Signed)
EMERGENCY DEPARTMENT ENCOUNTER    ATTENDING NOTE        Date of Emergency Department Visit: 11/06/2021  I have performed a history and physical examination of the patient and discussed his management with the physician assistant. I have reviewed the physician assistant's note, documented findings and plan of care except as noted.      Date of service: 11/06/2021 12:53 AM    Chief Complaint   Patient presents with   . Head Injury       Nursing notes reviewed and agreed with.    HISTORY OF PRESENT ILLNESS:    Corey Becker is a 76 y.o. male who  has a past medical history of Hypertension. who is hearing impaired per ASL video interpreter he fell a few days ago was seen at [facility] w/ head injury had steri strips placed R  Eyebrow no new injury however c/o ha and requesting to see social services to help find his phone     ROS  See HPI / PA note     PAST MEDICAL HISTORY / PROBLEM LIST    Past Medical History:   Diagnosis Date   . Hypertension        Patient Active Problem List   Diagnosis   . Altered mental status       PAST SURGICAL HISTORY    No past surgical history on file.    MEDICATIONS     There are no discharge medications for this patient.       ALLERGIES    No Known Allergies    SOCIAL HISTORY    Social History     Tobacco Use   . Smoking status: Not on file   . Smokeless tobacco: Not on file   Substance Use Topics   . Alcohol use: Not on file     Social History     Substance and Sexual Activity   Drug Use Not on file       FAMILY HISTORY    No family history on file.    Physical Exam  Triage vital signs: BP 120/87   Pulse 80   Temp 36 C (96.8 F)   Resp 16   SpO2 99%   Sleeping , awakens easily   Healing laceration R lateral eyebrow w/ steri strip in place   Skin is warm dry, anicteric, no jaundice,   No respir distress, lungs are clear to auscultation bilaterally, no hypoxia   Cardiovascular S1-S2 no pulse deficits,   Abdomen soft nontender nondistended with no rebound or guarding,   Extremities no  clubbing cyanosis or edema,   No facial asymmetry, moves all ext     DATA    LABS    Labs Reviewed   COMPREHENSIVE METABOLIC PANEL - Abnormal       Result Value    Sodium 140      Potassium 3.3 (*)     Chloride 107      CO2 (Bicarbonate) 22      Anion Gap 11      BUN 29 (*)     Creatinine 1.16      eGFRcr 66      Glucose 126      Fasting? Unknown      Calcium 8.5      AST 35      ALT 27      Alkaline phosphatase 115      Protein, total 6.0      Albumin 2.7 (*)  Bilirubin, total 0.7     CBC WITH DIFFERENTIAL - Abnormal    WBC 11.9 (*)     RBC 3.04 (*)     Hemoglobin 9.6 (*)     Hematocrit 28.7 (*)     MCV 94.4      MCH 31.6      MCHC 33.4      RDW-CV 12.7      RDW-SD 43.7      Platelets 187      MPV 9.6      Neutrophil % 83.7      Lymphocyte % 7.2      Monocytes % 7.4      Eosinophils % 0.7      Basophils % 0.2      Immature Granulocytes % 0.8      NRBC % 0.0      Neutrophils Absolute 9.98 (*)     Lymphocytes Absolute 0.86      Monocytes Absolute 0.88 (*)     Eosinophils Absolute 0.08      Basophils Absolute 0.02      Immature Granulocytes Absolute 0.09      NRBC Absolute 0.00     SDS, SERUM DRUG SCREEN - Normal    Acetaminophen level <5      Ethanol level, plasma/serum <10      Salicylate level <0.5      Benzodiazepines screen, serum Not Detected      Tricyclics screen, serum Not Detected     CBC W/DIFF    Narrative:     The following orders were created for panel order CBC and differential.  Procedure                               Abnormality         Status                     ---------                               -----------         ------                     CBC w/ Differential[[id number]]          Abnormal            Final result                 Please view results for these tests on the individual orders.        RADIOLOGY      CT HEAD WO CONTRAST   Final Result      1.  No acute territorial infarct or intracranial hemorrhage.     2. 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    3.  No displaced calvarial fracture and right periorbital preseptal soft tissue swelling with no postseptal or intraconal component.       DICTATED: Roland Earl   IN ACCORDANCE WITH DEPARTMENT POLICY, TEACHING PHYSICIANS REVIEW ALL IMAGES, AND EDIT REPORTS AS REQUIRED. A REPORT IS NOT FINAL UNTIL APPROVED BY A STAFF RADIOLOGIST.      APPROVED BY STAFF RADIOLOGIST: Smith Mince, MD 11/06/2021 9:17 AM EST            MEDICAL DECISION MAKING & ED COURSE  Differential Dx includes :  Closed head injury, intracranial hemorrhage, subdural hemorrhage, facial contusion, facial fracture, concussion;      Pertinent labs and imaging studies reviewed.  Nursing notes reviewed .  Previous records reviewed.     ED Course as of 11/07/21 1052   Wed Nov 06, 2021   0325 Potassium(!): 3.3   0325 Hemoglobin(!): 9.6   0325 Hematocrit(!): 28.7   0359 Sleeping, VSS   0509 Now awake alert ambulatory requesting to leave          Diagnoses as of 11/07/21 1052   Injury of head, subsequent encounter     ED Observation Initiation    The patient was placed into Emergency Department Observation Status     The patient was informed of the need for further observational care.    Preliminary diagnosis is:  Head injury; ha; request for social services    Reason for Emergency Department Observation:  Therapeutic intensity     Treatment Plan: Serial neurological exams; socoa; services      Family History:   Unable to obtain due to patient uncooperative    Progress Note:  ED Course as of 11/07/21 1052   Wed Nov 06, 2021   0325 Potassium(!): 3.3   0325 Hemoglobin(!): 9.6   0325 Hematocrit(!): 28.7   0359 Sleeping, VSS   0509 Now awake alert ambulatory requesting to leave          Diagnoses as of 11/07/21 1052   Injury of head, subsequent encounter           ED Observation Discharge Summary:     Clinical Course:  Symptoms improved in ED. patient initially requested social services assistance, however in the morning  stated he wanted to leave, the patient did not endorse any additional medical, psychiatric, or traumatic complaints or concerns.  The patient was appropriate for discharge.       Physical Exam  Vitals and nursing note reviewed.   Cardiovascular:      Rate and Rhythm: Normal rate.   Pulmonary:      Effort: Pulmonary effort is normal. No respiratory distress.   Neurological:      General: No focal deficit present.       Gait Steady     Mental Status:  alert awake  Psychiatric:         Mood and Affect: Mood normal.  No intent to harm       Diagnosis:   1. Injury of head, subsequent encounter    2.  Headache resolved  3.  Homelessness    CHRONIC CONDITIONS  Hypertension*    ACUTE CONDITIONS  Headache      SOCIAL DETERMINATES OF HEALTH  Housing instability    CLINICAL IMPRESSION:    1. Injury of head, subsequent encounter    2.  Headache resolved  3.  Homelessness        Gwendelyn Lanting A. Walker Kehr, MD  November 07, 2021, 10:52 AM  - - - - - - - - - - - - - - - - - - - - - - - - - - - - - - - - - - - - - - - - - - - - - - - - - - - - - - - - - - - - - - - -    Details of any procedures performed during this visit are documented separately in Procedure notes.   Portions of this electronic health record were generated  with Furniture conservator/restorer.        Karon Heckendorn A. Walker Kehr, MD  11/08/21 1515

## 2021-11-06 NOTE — ED Notes (Signed)
This RN spoke to pt via ASL interpreter. Pt requesting SW consult, informed SW is not present in hospital at this time. Pt discussed with RN that he had lost phone at previous hospital, needs new health insurance, drivers license, and social security card. RN recommending to PA Creedon that pt receives referral to [facility] at time of discharge for management. Pt with no other complaints at this time. Awaiting Head CT results.      Keane ScrapeAnnalee Hieta-Aho, RN  11/06/21 0310

## 2021-11-06 NOTE — Other (Signed)
Patient Education   Table of Contents       Head Injury, Adult     To view videos and all your education online visit,   [email or web address]   or scan this QR code with your smartphone.                    Head Injury, Adult         There are many types of head injuries. Head injuries can be as minor as a small bump, or they can be a serious medical issue. More severe head injuries include:       A jarring injury to the brain (concussion).       A bruise (contusion) of the brain. This means there is bleeding in the brain that can cause swelling.       A cracked skull (skull fracture).       Bleeding in the brain that collects, clots, and forms a bump (hematoma).     After a head injury, most problems occur within the first 24 hours, but side effects may occur up to 7?10 days after the injury. It is important to watch your condition for any changes. You may need to be observed in the emergency department or urgent care, or you may be admitted to the hospital.     What are the causes?   There are many possible causes of a head injury. Serious head injuries may be caused by car accidents, bicycle or motorcycle accidents, sports injuries, falls, or being struck by an object.   What are the symptoms?    Symptoms of a head injury include a contusion, bump, or bleeding at the site of the injury. Other physical symptoms may include:       Headache.       Nausea or vomiting.       Dizziness.       Blurred or double vision.       Being uncomfortable around bright lights or loud noises.       Seizures.       Feeling tired.       Trouble being awakened.       Loss of consciousness.      Mental or emotional symptoms may include:       Irritability.       Confusion and memory problems.       Poor attention and concentration.       Changes in eating or sleeping habits.       Anxiety or depression.     How is this diagnosed?   This condition can usually be diagnosed based on your symptoms, a description of the injury,  and a physical exam. You may also have imaging tests done, such as a CT scan or an MRI.   How is this treated?   Treatment for this condition depends on the severity and type of injury you have. The main goal of treatment is to prevent complications and allow the brain time to heal.   Mild head injury    If you have a mild head injury, you may be sent home, and treatment may include:       Observation. A responsible adult should stay with you for 24 hours after your injury and check on you often.       Physical rest.       Brain rest.       Pain medicines.     Severe head  injury    If you have a severe head injury, treatment may include:      Close observation. This includes hospitalization with the following care:       Frequent physical exams.       Frequent checks of how your brain and nervous system are working (neurological status).       Checking your blood pressure and oxygen levels.       Medicines to relieve pain, prevent seizures, and decrease brain swelling.       Airway protection and breathing support. This may include using a ventilator.       Treatments that monitor and manage swelling inside the brain.      Brain surgery. This may be needed to:       Remove a collection of blood or blood clots.       Stop the bleeding.       Remove a part of the skull to allow room for the brain to swell.     Follow these instructions at home:   Activity         Rest and avoid activities that are physically hard or tiring.       Make sure you get enough sleep.      Let your brain rest by limiting activities that require a lot of thought or attention, such as:       Watching TV.       Playing memory games and puzzles.       Job-related work or homework.       Working on [employer], and texting.       Avoid activities that could cause another head injury, such as playing sports, until your health care provider approves. Having another head injury, especially before the first one has healed, can be  dangerous.       Ask your health care provider when it is safe for you to return to your regular activities, including work or school. Ask your health care provider for a step-by-step plan for gradually returning to activities.       Ask your health care provider when you can drive, ride a bicycle, or use heavy machinery. Your ability to react may be slower after a brain injury. Do not  do these activities if you are dizzy.     Lifestyle           Do not  drink alcohol until your health care provider approves. Do not  use drugs. Alcohol and certain drugs may slow your recovery and can put you at risk of further injury.       If it is harder than usual to remember things, write them down.       If you are easily distracted, try to do one thing at a time.       Talk with family members or close friends when making important decisions.       Tell your friends, family, a trusted colleague, and work Freight forwarder about your injury, symptoms, and restrictions. Have them watch for any new or worsening problems.     General instructions         Take over-the-counter and prescription medicines only as told by your health care provider.       Have someone stay with you for 24 hours after your head injury. This person should watch you for any changes in your symptoms and be ready to seek medical help.  Keep all follow-up visits as told by your health care provider. This is important.       How is this prevented?         Work on improving your balance and strength to avoid falls.       Wear a seat belt when you are in a moving vehicle.       Wear a helmet when riding a bicycle, skiing, or doing any other sport or activity that has a risk of injury.      If you drink alcohol:      Limit how much you use to:       0?1 drink a day for nonpregnant women.       0?2 drinks a day for men.           Be aware of how much alcohol is in your drink. In the [place] one drink equals one 12 oz bottle of beer (355 mL), one 5 oz glass of wine (148  mL), or one 1? oz glass of hard liquor (44 mL).        Take safety measures in your home, such as:       Removing clutter and tripping hazards from floors and stairways.       Using grab bars in bathrooms and handrails by stairs.       Placing non-slip mats on floors and in bathtubs.       Improving lighting in dim areas.   Where to find more information         Centers for Disease Control and Prevention: http://www.wolf.info/       Get help right away if:        You have:       A severe headache that is not helped by medicine.       Trouble walking or weakness in your arms and legs.       Clear or bloody fluid coming from your nose or ears.       Changes in your vision.       A seizure.       Increased confusion or irritability.       Your symptoms get worse.       You are sleepier than normal and have trouble staying awake.       You lose your balance.       Your pupils change size.       Your speech is slurred.       Your dizziness gets worse.       You vomit.     These symptoms may represent a serious problem that is an emergency. Do not wait to see if the symptoms will go away. Get medical help right away. Call your local emergency services (911 in the [place] Do not drive yourself to the hospital.   Summary         Head injuries can be minor, or they can be a serious medical issue requiring immediate attention.       Treatment for this condition depends on the severity and type of injury you have.       Have someone stay with you for 24 hours after your injury and check on you often.       Ask your health care provider when it is safe for you to return to your regular activities, including work or school.       Head injury prevention includes wearing a seat  belt in a motor vehicle, using a helmet on a bicycle, limiting alcohol use, and taking safety measures in your home.     This information is not intended to replace advice given to you by your health care provider. Make sure you discuss any questions you have with  your health care provider.     Document Released: 12/18/2006Document Revised: 10/30/2020Document Reviewed: 07/22/2019     Elsevier Patient Education ? [place].

## 2021-11-13 ENCOUNTER — Emergency Department (HOSPITAL_BASED_OUTPATIENT_CLINIC_OR_DEPARTMENT_OTHER): Payer: 59

## 2021-11-13 ENCOUNTER — Emergency Department
Admission: EM | Admit: 2021-11-13 | Discharge: 2021-11-13 | Disposition: A | Discharge status: Home / Self Care | Payer: 59 | Attending: Emergency Medicine | Admitting: Emergency Medicine

## 2021-11-13 DIAGNOSIS — R058 Other specified cough: Secondary | ICD-10-CM | POA: Diagnosis present

## 2021-11-13 DIAGNOSIS — Z5900 Homelessness unspecified: Secondary | ICD-10-CM | POA: Diagnosis not present

## 2021-11-13 DIAGNOSIS — H9193 Unspecified hearing loss, bilateral: Secondary | ICD-10-CM

## 2021-11-13 DIAGNOSIS — R059 Cough, unspecified: Secondary | ICD-10-CM

## 2021-11-13 DIAGNOSIS — R051 Acute cough: Secondary | ICD-10-CM

## 2021-11-13 MED ORDER — DOXYCYCLINE MONOHYDRATE 100 MG PO TABS
100.00 mg | ORAL_TABLET | Freq: Once | ORAL | Status: AC
Start: 2021-11-13 — End: 2021-11-13
  Administered 2021-11-13: 100 mg via ORAL
  Filled 2021-11-13: qty 1

## 2021-11-13 MED ORDER — BENZONATATE 100 MG PO CAPS
100.0000 mg | ORAL_CAPSULE | Freq: Three times a day (TID) | ORAL | 0 refills | Status: DC | PRN
Start: 2021-11-13 — End: 2021-11-13

## 2021-11-13 MED ORDER — BENZONATATE 100 MG PO CAPS
100.00 mg | ORAL_CAPSULE | Freq: Three times a day (TID) | ORAL | 0 refills | Status: AC | PRN
Start: 2021-11-13 — End: 2021-11-20

## 2021-11-13 MED ORDER — DOXYCYCLINE HYCLATE 100 MG PO TABS
100.0000 mg | ORAL_TABLET | Freq: Two times a day (BID) | ORAL | 0 refills | Status: DC
Start: 2021-11-13 — End: 2021-11-13

## 2021-11-13 MED ORDER — BENZONATATE 100 MG PO CAPS
100.00 mg | ORAL_CAPSULE | Freq: Once | ORAL | Status: AC
Start: 2021-11-13 — End: 2021-11-13
  Administered 2021-11-13: 100 mg via ORAL
  Filled 2021-11-13: qty 1

## 2021-11-13 MED ORDER — DOXYCYCLINE HYCLATE 100 MG PO TABS
100.00 mg | ORAL_TABLET | Freq: Two times a day (BID) | ORAL | 0 refills | Status: AC
Start: 2021-11-13 — End: 2021-11-20
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
## 2021-11-13 NOTE — ED Notes (Signed)
Addendum:  Patient received in signout at change of shift.  Select available records reviewed.    Updates:  ED Course as of 11/13/21 1324   Wed Nov 13, 2021   1321 Discussed with CM patient had been incarcerated and released 2 weeks ago. Looking for apartment. Has access to a shelter and [employer]. ASL interpreter.  Patient will be provided prescriptions, voucher to transport him back to his shelter.   1201 Discussed with case management.  Apparently patient without local care.  Has moved up here from [facility].  Does not have significant footprint and record suggesting he does not have access to local resources.  Case management waiting for sign language interpreter to help clarify situation and provide appropriate resources   [phone]   IMPRESSION:     There are interstitial changes. The differential diagnosis includes chronic lung disease and atypical pneumonia     0716 Assumed care from my colleague at shift change. Briefly, patient is 76 year old male with hearing impairment, homeless, with cough, no fever, good sat. Wanted respite and to speak with ED CM (does not feel outpatient CM advocating for him per overnight ED attending).    []  CXR  []  CM       Impression:  Subacute cough  Homelessness  Bilateral deafness    Condition: Stable    Disposition: Discharge      , MD

## 2021-11-13 NOTE — ED Triage Note (Signed)
76 yo M presents to ED via EMS with c/o of productive cough x2 weeks. Pt is deaf and reports "I want that red medicine for the cough and I want a place to sleep".  Denies ETOH/drug use. Denies SI/HI

## 2021-11-13 NOTE — Narrator Note (Signed)
Patient Disposition  Patient education for diagnosis, medications, activity, diet and follow-up.  Patient left ED 1:39 PM.  Patient rep received written instructions.    Interpreter to provide instructions: Yes; Interpreter ID:   ASL    Patient belongings with patient: YES    Have all existing LDAs been addressed? N/A    Have all IV infusions been stopped? N/A    Destination: Discharged to home  CM Educated pt on AVS. Expressed understanding with no further questions. Ambulated out of dept safely.

## 2021-11-13 NOTE — Narrator Note (Signed)
Pt up and ambulating with even steady gait to bathroom.  Pt cleared for discharge

## 2021-11-13 NOTE — Discharge Instructions (Addendum)
PLEASE REVIEW    Please review the following information being provided related to today's Emergency Department visit.    You were evaluated in the emergency department for your cough.  Your chest x-ray showed likely chronic lung disease but is also possible you have an atypical pneumonia given your relatively acute course and risk factors.    As part of your treatment you are being prescribed a course of antibiotic medicines.     It is important that you take the full course of this medicine though your original symptoms may resolve before the scheduled completion of your antibiotic doses.    Some people feel mild stomach upset and may have loose stool when taking these medicines which can be a normal and benign side effect of these types of medicines. Taking these medicines with a full glass of water and/or may help should you experience these symptoms.     A few people can have more serious reactions to these medicines. If you develop sudden shortness of breath, feeling like your throat is closing, chest tightness, itchy rash, or swelling call 911 and seek care immediately. If during your course of antibiotics or in the weeks after,  you develop severe abdominal pain with >5 episodes  persistent foul smelling diarrhea, call your doctor and seek care.    Even with the potential risks of side effects, the benefit of treating your condition with antibiotics outweighs the risk of the above side effects. It is important that you take the entire course as prescribed.  Call your regular doctor or this department if you have any concerns.     You are also being provided a cough medicine to help relieve your symptoms.  Be sure to take the entire course of antibiotics at a minimum and the cough medicine    Seek care if your symptoms are not noticeably improved within 1 week.

## 2021-11-13 NOTE — ED Care Mgmt (Signed)
Pt is seen in the [facility] reporting cough.  He will be treated with cough medication and antibiotics.  His requires sign language and we await assistance from a bedside interpreter after the tablet interpreters did not work.  His Insurance is identified as [employer] .  Their address is [address] [place] [address], case management number there is [phone].  Their main phone is [phone].  At this time I am reaching our to their case management department, there is a 40 minute wait for help.

## 2021-11-13 NOTE — Narrator Note (Signed)
Case management waiting for in person ASL interpreter to come down to talk to pt

## 2021-11-13 NOTE — Narrator Note (Signed)
Pt resting in stretcher, vitals updated, waiting for case management/social work

## 2021-11-13 NOTE — ED Provider Notes (Addendum)
I have reviewed the ED nursing notes and prior records. I have reviewed the patient's past medical history/problem list, allergies, social history and medication list.  I saw this patient primarily.    HPI:  This 76 year old male patient presents with chief complaint of productive cough for the past two weeks. Patient is requesting a place to sleep and cough medication. No chest pain. No SOB.    Review of Systems:  Pertinent positives were reviewed as per the HPI above. All other systems were reviewed and are negative.    Past Medical History/Problem List:  No past medical history on file.  There is no problem list on file for this patient.      Past Surgical History:  No past surgical history on file.    Medications:   No current facility-administered medications on file prior to encounter.  No current outpatient medications on file prior to encounter.      Social History:  Social History    Tobacco Use      Smoking status: Not on file      Smokeless tobacco: Not on file    Alcohol use: Not on file      Allergies:  Review of Patient's Allergies indicates:  No Known Allergies    Physical Exam:  ED Triage Vitals [11/13/21 0016]   Enc Vitals Group      BP 154/87      Pulse 98      Resp 17      Temp 99.2 F      Temp src       SpO2 99 %      Weight 90.7 kg (200 lb)      Height       Head Circumference       Peak Flow       Pain Score       Pain Loc       Pain Edu?       Excl. in [place]?        GENERAL:  No acute distress.  SKIN:  Warm & Dry, no rash  HEENT:   Oropharynx clear, no exudates or erythema, moist mucous membranes  NECK:  Supple  LUNGS:  Clear to auscultation bilaterally    HEART:  RRR   ABDOMEN:  Soft, nt  MUSCULOSKELETAL:  No deformities. Well-perfused extremities.   NEUROLOGIC:  No focal deficits    Course and Medical Decision-making:    Pertinent labs and imaging studies reviewed.  Emergency Department nursing record was.  Prior records as available [facility] record were reviewed.    In  brief this is a  76 year old male presenting with cough for two weeks. He is well appearing. Patient is homeless and wants somewhere to sleep. Patient was given tessalon perles in the ED and will be given a referral to his pcp.  Social determinants of care: homelessness  The patient upon reevaluation, would like to speak to social work this morning. States he needs help with resources at home, and his social worker is not currently providing the resources he needs.    Care will be transitioned to my colleague at shift change.       PCP:  No primary care provider on file.    Diagnosis/Diagnoses:  Cough  Homelessness    Renne Musca, [place]

## 2024-03-12 LAB — COMPREHENSIVE METABOLIC PANEL
ALT: 18 U/L (ref 0–55)
AST: 39 U/L (ref 6–42)
Albumin: 3.5 g/dL (ref 3.2–5.0)
Alkaline phosphatase: 136 U/L — ABNORMAL HIGH (ref 30–130)
Anion Gap: 12 mmol/L (ref 3–14)
BUN: 64 mg/dL — ABNORMAL HIGH (ref 6–24)
Bilirubin, total: 0.6 mg/dL (ref 0.2–1.2)
CO2 (Bicarbonate): 14 mmol/L — ABNORMAL LOW (ref 20–32)
Calcium: 9.3 mg/dL (ref 8.5–10.5)
Chloride: 117 mmol/L — ABNORMAL HIGH (ref 98–110)
Creatinine: 4.68 mg/dL — ABNORMAL HIGH (ref 0.55–1.30)
Glucose: 111 mg/dL — ABNORMAL HIGH (ref 70–110)
Potassium: 4.9 mmol/L (ref 3.6–5.2)
Protein, total: 6.7 g/dL (ref 6.0–8.4)
Sodium: 143 mmol/L (ref 135–146)
eGFRcr: 12 mL/min/{1.73_m2} — ABNORMAL LOW (ref >=60)

## 2024-03-12 LAB — CBC
Hematocrit: 35.2 % — ABNORMAL LOW (ref 37.0–53.0)
Hemoglobin: 12 g/dL — ABNORMAL LOW (ref 13.0–17.5)
MCH: 32.6 pg (ref 26.0–34.0)
MCHC: 34.1 g/dL (ref 31.0–37.0)
MCV: 95.7 fL (ref 80.0–100.0)
MPV: 10.4 fL (ref 9.1–12.4)
NRBC %: 0 % (ref 0.0–0.0)
NRBC Absolute: 0 10*3/uL (ref 0.00–2.00)
Platelets: 139 10*3/uL — ABNORMAL LOW (ref 150–400)
RBC: 3.68 M/uL — ABNORMAL LOW (ref 4.20–5.90)
RDW-CV: 14.6 % — ABNORMAL HIGH (ref 11.5–14.5)
RDW-SD: 50.5 fL (ref 35.0–51.0)
WBC: 8.7 10*3/uL (ref 4.0–11.0)

## 2024-03-12 LAB — BILIRUBIN, DIRECT: Bilirubin, direct: 0.2 mg/dL (ref 0.0–0.5)

## 2024-03-14 LAB — BMP (EXT)
Anion Gap (EXT): 11 mmol/L (ref 2–15)
BUN (EXT): 60 mg/dL — ABNORMAL HIGH (ref 7–24)
CO2 (EXT): 11 mmol/L — ABNORMAL LOW (ref 24–32)
CalciumCalcium (EXT): 9.5 mg/dL (ref 8.5–10.5)
Chloride (EXT): 115 mmol/L — ABNORMAL HIGH (ref 98–110)
Creatinine (EXT): 4 mg/dL — ABNORMAL HIGH (ref 0.60–1.30)
Glucose (EXT): 137 mg/dL — ABNORMAL HIGH (ref 50–100)
Potassium (EXT): 5.5 mmol/L — ABNORMAL HIGH (ref 3.4–5.2)
Sodium (EXT): 137 mmol/L (ref 135–146)
eGFR - Creat MDRD (EXT): 15 mL/min/BSA — ABNORMAL LOW (ref >=60)

## 2024-03-14 LAB — UNMAPPED LAB RESULTS
Creatinine, urine, random (INT/EXT): 132 mg/dL (ref >=15.0)
Phosphorous (EXT): 3.9 mg/dL (ref 2.3–4.6)
Potassium (EXT): 4.6 mmol/L (ref 3.4–5.2)

## 2024-03-15 LAB — BMP (EXT)
Anion Gap (EXT): 12 mmol/L (ref 2–15)
BUN (EXT): 49 mg/dL — ABNORMAL HIGH (ref 7–24)
CO2 (EXT): 9 mmol/L — ABNORMAL LOW (ref 24–32)
CalciumCalcium (EXT): 9.1 mg/dL (ref 8.5–10.5)
Chloride (EXT): 120 mmol/L — ABNORMAL HIGH (ref 98–110)
Creatinine (EXT): 3.3 mg/dL — ABNORMAL HIGH (ref 0.60–1.30)
Glucose (EXT): 105 mg/dL — ABNORMAL HIGH (ref 50–100)
Potassium (EXT): 4.1 mmol/L (ref 3.4–5.2)
Sodium (EXT): 142 mmol/L (ref 135–146)
eGFR - Creat MDRD (EXT): 18 mL/min/BSA — ABNORMAL LOW (ref >=60)

## 2024-03-16 LAB — BMP (EXT)
Anion Gap (EXT): 15 mmol/L (ref 2–15)
BUN (EXT): 49 mg/dL — ABNORMAL HIGH (ref 7–24)
CO2 (EXT): 11 mmol/L — ABNORMAL LOW (ref 24–32)
CalciumCalcium (EXT): 9.6 mg/dL (ref 8.5–10.5)
Chloride (EXT): 116 mmol/L — ABNORMAL HIGH (ref 98–110)
Creatinine (EXT): 2.9 mg/dL — ABNORMAL HIGH (ref 0.60–1.30)
Glucose (EXT): 95 mg/dL (ref 50–100)
Potassium (EXT): 3.8 mmol/L (ref 3.4–5.2)
Sodium (EXT): 142 mmol/L (ref 135–146)
eGFR - Creat MDRD (EXT): 21 mL/min/BSA — ABNORMAL LOW (ref >=60)

## 2024-03-17 LAB — BMP (EXT)
Anion Gap (EXT): 11 mmol/L (ref 2–15)
BUN (EXT): 47 mg/dL — ABNORMAL HIGH (ref 7–24)
CO2 (EXT): 11 mmol/L — ABNORMAL LOW (ref 24–32)
CalciumCalcium (EXT): 9.2 mg/dL (ref 8.5–10.5)
Chloride (EXT): 122 mmol/L — ABNORMAL HIGH (ref 98–110)
Creatinine (EXT): 2.7 mg/dL — ABNORMAL HIGH (ref 0.60–1.30)
Glucose (EXT): 122 mg/dL — ABNORMAL HIGH (ref 50–100)
Potassium (EXT): 3.7 mmol/L (ref 3.4–5.2)
Sodium (EXT): 144 mmol/L (ref 135–146)
eGFR - Creat MDRD (EXT): 23 mL/min/BSA — ABNORMAL LOW (ref >=60)

## 2024-03-18 LAB — BMP (EXT)
Anion Gap (EXT): 10 mmol/L (ref 2–15)
BUN (EXT): 42 mg/dL — ABNORMAL HIGH (ref 7–24)
CO2 (EXT): 11 mmol/L — ABNORMAL LOW (ref 24–32)
CalciumCalcium (EXT): 9 mg/dL (ref 8.5–10.5)
Chloride (EXT): 124 mmol/L — ABNORMAL HIGH (ref 98–110)
Creatinine (EXT): 2.5 mg/dL — ABNORMAL HIGH (ref 0.60–1.30)
Glucose (EXT): 97 mg/dL (ref 50–100)
Potassium (EXT): 3.4 mmol/L (ref 3.4–5.2)
Sodium (EXT): 146 mmol/L (ref 135–146)
eGFR - Creat MDRD (EXT): 25 mL/min/BSA — ABNORMAL LOW (ref >=60)

## 2024-03-19 LAB — BMP (EXT)
Anion Gap (EXT): 12 mmol/L (ref 2–15)
BUN (EXT): 34 mg/dL — ABNORMAL HIGH (ref 7–24)
CO2 (EXT): 13 mmol/L — ABNORMAL LOW (ref 24–32)
CalciumCalcium (EXT): 8.8 mg/dL (ref 8.5–10.5)
Chloride (EXT): 119 mmol/L — ABNORMAL HIGH (ref 98–110)
Creatinine (EXT): 2.1 mg/dL — ABNORMAL HIGH (ref 0.60–1.30)
Glucose (EXT): 98 mg/dL (ref 50–100)
Potassium (EXT): 3.4 mmol/L (ref 3.4–5.2)
Sodium (EXT): 143 mmol/L (ref 135–146)
eGFR - Creat MDRD (EXT): 31 mL/min/BSA — ABNORMAL LOW (ref >=60)

## 2024-03-20 LAB — BMP (EXT)
Anion Gap (EXT): 10 mmol/L (ref 2–15)
BUN (EXT): 31 mg/dL — ABNORMAL HIGH (ref 7–24)
CO2 (EXT): 12 mmol/L — ABNORMAL LOW (ref 24–32)
CalciumCalcium (EXT): 8.7 mg/dL (ref 8.5–10.5)
Chloride (EXT): 119 mmol/L — ABNORMAL HIGH (ref 98–110)
Creatinine (EXT): 2.2 mg/dL — ABNORMAL HIGH (ref 0.60–1.30)
Glucose (EXT): 125 mg/dL — ABNORMAL HIGH (ref 50–100)
Potassium (EXT): 3.2 mmol/L — ABNORMAL LOW (ref 3.4–5.2)
Sodium (EXT): 141 mmol/L (ref 135–146)
eGFR - Creat MDRD (EXT): 29 mL/min/BSA — ABNORMAL LOW (ref >=60)

## 2024-03-20 LAB — UNMAPPED LAB RESULTS: Potassium (EXT): 4.3 mmol/L (ref 3.4–5.2)

## 2024-03-21 LAB — BMP (EXT)
Anion Gap (EXT): 11 mmol/L (ref 2–15)
BUN (EXT): 34 mg/dL — ABNORMAL HIGH (ref 7–24)
CO2 (EXT): 13 mmol/L — ABNORMAL LOW (ref 24–32)
CalciumCalcium (EXT): 8.7 mg/dL (ref 8.5–10.5)
Chloride (EXT): 116 mmol/L — ABNORMAL HIGH (ref 98–110)
Creatinine (EXT): 2.2 mg/dL — ABNORMAL HIGH (ref 0.60–1.30)
Glucose (EXT): 120 mg/dL — ABNORMAL HIGH (ref 50–100)
Potassium (EXT): 3.5 mmol/L (ref 3.4–5.2)
Sodium (EXT): 140 mmol/L (ref 135–146)
eGFR - Creat MDRD (EXT): 29 mL/min/BSA — ABNORMAL LOW (ref >=60)
